# Patient Record
Sex: Female | Born: 1940 | Race: White | Hispanic: No | Smoking: Former smoker
Health system: Southern US, Community
[De-identification: ages and names within clinical notes are randomized; demographics above are authoritative.]

## PROBLEM LIST (undated history)

## (undated) DIAGNOSIS — N321 Vesicointestinal fistula: Secondary | ICD-10-CM

## (undated) DIAGNOSIS — K5730 Diverticulosis of large intestine without perforation or abscess without bleeding: Secondary | ICD-10-CM

## (undated) DIAGNOSIS — E0590 Thyrotoxicosis, unspecified without thyrotoxic crisis or storm: Secondary | ICD-10-CM

## (undated) DIAGNOSIS — C189 Malignant neoplasm of colon, unspecified: Secondary | ICD-10-CM

## (undated) DIAGNOSIS — H3530 Unspecified macular degeneration: Secondary | ICD-10-CM

## (undated) DIAGNOSIS — I10 Essential (primary) hypertension: Secondary | ICD-10-CM

## (undated) DIAGNOSIS — B0050 Herpesviral ocular disease, unspecified: Secondary | ICD-10-CM

## (undated) DIAGNOSIS — K219 Gastro-esophageal reflux disease without esophagitis: Secondary | ICD-10-CM

## (undated) DIAGNOSIS — M1990 Unspecified osteoarthritis, unspecified site: Secondary | ICD-10-CM

## (undated) DIAGNOSIS — E785 Hyperlipidemia, unspecified: Secondary | ICD-10-CM

## (undated) DIAGNOSIS — F329 Major depressive disorder, single episode, unspecified: Secondary | ICD-10-CM

## (undated) DIAGNOSIS — Z933 Colostomy status: Secondary | ICD-10-CM

## (undated) HISTORY — DX: Unspecified macular degeneration: H35.30

## (undated) HISTORY — DX: Herpesviral ocular disease, unspecified: B00.50

## (undated) HISTORY — DX: Gastro-esophageal reflux disease without esophagitis: K21.9

## (undated) HISTORY — DX: Diverticulosis of large intestine without perforation or abscess without bleeding: K57.30

## (undated) HISTORY — PX: DILATION AND CURETTAGE OF UTERUS: SHX78

## (undated) HISTORY — DX: Major depressive disorder, single episode, unspecified: F32.9

## (undated) HISTORY — DX: Colostomy status: Z93.3

## (undated) HISTORY — PX: OTHER SURGICAL HISTORY: SHX169

## (undated) HISTORY — DX: Essential (primary) hypertension: I10

## (undated) HISTORY — DX: Hyperlipidemia, unspecified: E78.5

## (undated) HISTORY — DX: Unspecified osteoarthritis, unspecified site: M19.90

## (undated) HISTORY — DX: Thyrotoxicosis, unspecified without thyrotoxic crisis or storm: E05.90

## (undated) HISTORY — DX: Malignant neoplasm of colon, unspecified: C18.9

## (undated) HISTORY — DX: Vesicointestinal fistula: N32.1

---
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
## 1979-02-17 HISTORY — PX: SEPTOPLASTY: SUR1290

## 1989-11-16 HISTORY — PX: CARDIAC CATHETERIZATION: SHX172

## 1989-12-17 DIAGNOSIS — F32A Depression, unspecified: Secondary | ICD-10-CM

## 1989-12-17 HISTORY — DX: Depression, unspecified: F32.A

## 1993-06-18 DIAGNOSIS — E785 Hyperlipidemia, unspecified: Secondary | ICD-10-CM

## 1993-06-18 HISTORY — DX: Hyperlipidemia, unspecified: E78.5

## 1996-06-18 HISTORY — PX: COLONOSCOPY: SHX174

## 1998-01-05 ENCOUNTER — Ambulatory Visit (HOSPITAL_COMMUNITY): Admission: RE | Admit: 1998-01-05 | Discharge: 1998-01-05 | Payer: Self-pay | Admitting: Gynecology

## 1998-03-28 ENCOUNTER — Emergency Department (HOSPITAL_COMMUNITY): Admission: EM | Admit: 1998-03-28 | Discharge: 1998-03-28 | Payer: Self-pay | Admitting: Emergency Medicine

## 1998-09-14 ENCOUNTER — Other Ambulatory Visit: Admission: RE | Admit: 1998-09-14 | Discharge: 1998-09-14 | Payer: Self-pay | Admitting: Gynecology

## 1999-09-26 ENCOUNTER — Other Ambulatory Visit: Admission: RE | Admit: 1999-09-26 | Discharge: 1999-09-26 | Payer: Self-pay | Admitting: Gynecology

## 2000-05-21 ENCOUNTER — Encounter: Payer: Self-pay | Admitting: Emergency Medicine

## 2000-05-21 ENCOUNTER — Emergency Department (HOSPITAL_COMMUNITY): Admission: EM | Admit: 2000-05-21 | Discharge: 2000-05-21 | Payer: Self-pay | Admitting: Emergency Medicine

## 2000-08-09 ENCOUNTER — Encounter: Payer: Self-pay | Admitting: Orthopaedic Surgery

## 2000-08-09 ENCOUNTER — Encounter: Admission: RE | Admit: 2000-08-09 | Discharge: 2000-08-09 | Payer: Self-pay | Admitting: Orthopaedic Surgery

## 2000-11-28 ENCOUNTER — Other Ambulatory Visit: Admission: RE | Admit: 2000-11-28 | Discharge: 2000-11-28 | Payer: Self-pay | Admitting: Gynecology

## 2001-09-05 ENCOUNTER — Encounter: Admission: RE | Admit: 2001-09-05 | Discharge: 2001-09-05 | Payer: Self-pay | Admitting: Gynecology

## 2001-09-05 ENCOUNTER — Encounter: Payer: Self-pay | Admitting: Gynecology

## 2001-10-29 ENCOUNTER — Encounter: Admission: RE | Admit: 2001-10-29 | Discharge: 2001-10-29 | Payer: Self-pay | Admitting: Orthopaedic Surgery

## 2001-10-29 ENCOUNTER — Encounter: Payer: Self-pay | Admitting: Orthopaedic Surgery

## 2001-12-01 ENCOUNTER — Other Ambulatory Visit: Admission: RE | Admit: 2001-12-01 | Discharge: 2001-12-01 | Payer: Self-pay | Admitting: Gynecology

## 2002-06-18 DIAGNOSIS — K5730 Diverticulosis of large intestine without perforation or abscess without bleeding: Secondary | ICD-10-CM

## 2002-06-18 DIAGNOSIS — K219 Gastro-esophageal reflux disease without esophagitis: Secondary | ICD-10-CM

## 2002-06-18 HISTORY — DX: Diverticulosis of large intestine without perforation or abscess without bleeding: K57.30

## 2002-06-18 HISTORY — DX: Gastro-esophageal reflux disease without esophagitis: K21.9

## 2002-06-24 HISTORY — PX: COLONOSCOPY: SHX174

## 2002-06-24 HISTORY — PX: ESOPHAGOGASTRODUODENOSCOPY: SHX1529

## 2003-03-11 ENCOUNTER — Other Ambulatory Visit: Admission: RE | Admit: 2003-03-11 | Discharge: 2003-03-11 | Payer: Self-pay | Admitting: Family Medicine

## 2003-03-11 ENCOUNTER — Encounter: Payer: Self-pay | Admitting: Family Medicine

## 2003-06-19 HISTORY — PX: BACK SURGERY: SHX140

## 2004-02-18 ENCOUNTER — Encounter: Admission: RE | Admit: 2004-02-18 | Discharge: 2004-02-18 | Payer: Self-pay | Admitting: Orthopedic Surgery

## 2004-03-20 ENCOUNTER — Observation Stay (HOSPITAL_COMMUNITY): Admission: RE | Admit: 2004-03-20 | Discharge: 2004-03-21 | Payer: Self-pay | Admitting: Orthopedic Surgery

## 2004-05-23 ENCOUNTER — Ambulatory Visit: Payer: Self-pay | Admitting: Family Medicine

## 2004-10-19 ENCOUNTER — Ambulatory Visit: Payer: Self-pay | Admitting: Family Medicine

## 2005-02-21 ENCOUNTER — Encounter: Admission: RE | Admit: 2005-02-21 | Discharge: 2005-02-21 | Payer: Self-pay | Admitting: Orthopedic Surgery

## 2005-03-01 ENCOUNTER — Ambulatory Visit: Payer: Self-pay | Admitting: Family Medicine

## 2005-04-03 HISTORY — PX: KNEE ARTHROSCOPY: SUR90

## 2005-04-23 ENCOUNTER — Ambulatory Visit: Payer: Self-pay | Admitting: Family Medicine

## 2005-11-13 ENCOUNTER — Ambulatory Visit: Payer: Self-pay | Admitting: Family Medicine

## 2005-11-15 ENCOUNTER — Ambulatory Visit: Payer: Self-pay | Admitting: Family Medicine

## 2005-11-15 ENCOUNTER — Encounter: Payer: Self-pay | Admitting: Family Medicine

## 2005-11-15 ENCOUNTER — Other Ambulatory Visit: Admission: RE | Admit: 2005-11-15 | Discharge: 2005-11-15 | Payer: Self-pay | Admitting: Family Medicine

## 2006-01-27 ENCOUNTER — Encounter: Admission: RE | Admit: 2006-01-27 | Discharge: 2006-01-27 | Payer: Self-pay | Admitting: Orthopedic Surgery

## 2006-05-20 ENCOUNTER — Ambulatory Visit: Payer: Self-pay | Admitting: Family Medicine

## 2006-05-23 ENCOUNTER — Encounter: Admission: RE | Admit: 2006-05-23 | Discharge: 2006-05-23 | Payer: Self-pay | Admitting: Family Medicine

## 2006-11-20 ENCOUNTER — Ambulatory Visit: Payer: Self-pay | Admitting: Family Medicine

## 2006-11-20 LAB — CONVERTED CEMR LAB
ALT: 26 units/L (ref 0–40)
BUN: 10 mg/dL (ref 6–23)
Basophils Absolute: 0 10*3/uL (ref 0.0–0.1)
Basophils Relative: 0.4 % (ref 0.0–1.0)
CO2: 30 meq/L (ref 19–32)
Calcium: 9.8 mg/dL (ref 8.4–10.5)
Chloride: 102 meq/L (ref 96–112)
Direct LDL: 106 mg/dL
Eosinophils Absolute: 0.1 10*3/uL (ref 0.0–0.6)
Eosinophils Relative: 2.7 % (ref 0.0–5.0)
GFR calc Af Amer: 93 mL/min
Lymphocytes Relative: 44.8 % (ref 12.0–46.0)
Monocytes Absolute: 0.5 10*3/uL (ref 0.2–0.7)
Monocytes Relative: 9.4 % (ref 3.0–11.0)
RBC: 4.33 M/uL (ref 3.87–5.11)
Total Bilirubin: 0.7 mg/dL (ref 0.3–1.2)
Total CHOL/HDL Ratio: 7.6
Triglycerides: 869 mg/dL (ref 0–149)
VLDL: 174 mg/dL — ABNORMAL HIGH (ref 0–40)

## 2006-11-21 ENCOUNTER — Encounter: Payer: Self-pay | Admitting: Family Medicine

## 2006-11-21 DIAGNOSIS — K5730 Diverticulosis of large intestine without perforation or abscess without bleeding: Secondary | ICD-10-CM | POA: Insufficient documentation

## 2006-11-21 DIAGNOSIS — K219 Gastro-esophageal reflux disease without esophagitis: Secondary | ICD-10-CM

## 2006-11-21 DIAGNOSIS — F329 Major depressive disorder, single episode, unspecified: Secondary | ICD-10-CM

## 2006-11-21 DIAGNOSIS — I10 Essential (primary) hypertension: Secondary | ICD-10-CM | POA: Insufficient documentation

## 2006-11-21 DIAGNOSIS — E785 Hyperlipidemia, unspecified: Secondary | ICD-10-CM

## 2006-11-22 ENCOUNTER — Encounter: Payer: Self-pay | Admitting: Family Medicine

## 2006-11-22 ENCOUNTER — Other Ambulatory Visit: Admission: RE | Admit: 2006-11-22 | Discharge: 2006-11-22 | Payer: Self-pay | Admitting: Family Medicine

## 2006-11-22 ENCOUNTER — Ambulatory Visit: Payer: Self-pay | Admitting: Family Medicine

## 2006-11-22 LAB — CONVERTED CEMR LAB: Pap Smear: NORMAL

## 2006-12-03 ENCOUNTER — Encounter (INDEPENDENT_AMBULATORY_CARE_PROVIDER_SITE_OTHER): Payer: Self-pay | Admitting: *Deleted

## 2007-02-05 ENCOUNTER — Telehealth (INDEPENDENT_AMBULATORY_CARE_PROVIDER_SITE_OTHER): Payer: Self-pay | Admitting: *Deleted

## 2007-02-11 ENCOUNTER — Ambulatory Visit: Payer: Self-pay | Admitting: Family Medicine

## 2007-02-19 ENCOUNTER — Encounter: Payer: Self-pay | Admitting: Family Medicine

## 2007-02-24 ENCOUNTER — Encounter (INDEPENDENT_AMBULATORY_CARE_PROVIDER_SITE_OTHER): Payer: Self-pay | Admitting: *Deleted

## 2007-03-10 ENCOUNTER — Telehealth (INDEPENDENT_AMBULATORY_CARE_PROVIDER_SITE_OTHER): Payer: Self-pay | Admitting: *Deleted

## 2007-05-22 ENCOUNTER — Ambulatory Visit: Payer: Self-pay | Admitting: Family Medicine

## 2007-05-28 ENCOUNTER — Ambulatory Visit: Payer: Self-pay | Admitting: Family Medicine

## 2007-05-28 LAB — CONVERTED CEMR LAB
CO2: 27 meq/L (ref 19–32)
Calcium: 9.9 mg/dL (ref 8.4–10.5)
Chloride: 103 meq/L (ref 96–112)
GFR calc Af Amer: 81 mL/min
GFR calc non Af Amer: 67 mL/min

## 2007-06-02 ENCOUNTER — Ambulatory Visit: Payer: Self-pay | Admitting: Family Medicine

## 2007-11-27 ENCOUNTER — Ambulatory Visit: Payer: Self-pay | Admitting: Family Medicine

## 2007-11-27 LAB — CONVERTED CEMR LAB
ALT: 22 units/L (ref 0–35)
AST: 25 units/L (ref 0–37)
Albumin: 3.6 g/dL (ref 3.5–5.2)
Alkaline Phosphatase: 61 units/L (ref 39–117)
Basophils Absolute: 0 10*3/uL (ref 0.0–0.1)
Cholesterol: 263 mg/dL (ref 0–200)
Creatinine, Ser: 0.9 mg/dL (ref 0.4–1.2)
Eosinophils Absolute: 0.2 10*3/uL (ref 0.0–0.7)
Eosinophils Relative: 2.4 % (ref 0.0–5.0)
GFR calc Af Amer: 81 mL/min
HDL: 44.2 mg/dL (ref >39.0)
Hemoglobin: 12.9 g/dL (ref 12.0–15.0)
Lymphocytes Relative: 30.6 % (ref 12.0–46.0)
MCV: 90.9 fL (ref 78.0–100.0)
Monocytes Absolute: 0.5 10*3/uL (ref 0.1–1.0)
Monocytes Relative: 6.8 % (ref 3.0–12.0)
Platelets: 186 10*3/uL (ref 150–400)
Potassium: 4.5 meq/L (ref 3.5–5.1)
RBC: 4.08 M/uL (ref 3.87–5.11)
RDW: 12.9 % (ref 11.5–14.6)
Total CHOL/HDL Ratio: 6
Triglycerides: 296 mg/dL (ref 0–149)
VLDL: 59 mg/dL — ABNORMAL HIGH (ref 0–40)

## 2007-11-28 ENCOUNTER — Other Ambulatory Visit: Admission: RE | Admit: 2007-11-28 | Discharge: 2007-11-28 | Payer: Self-pay | Admitting: Family Medicine

## 2007-11-28 ENCOUNTER — Ambulatory Visit: Payer: Self-pay | Admitting: Family Medicine

## 2007-11-28 ENCOUNTER — Encounter: Payer: Self-pay | Admitting: Family Medicine

## 2007-11-28 LAB — CONVERTED CEMR LAB: Pap Smear: NORMAL

## 2007-12-04 ENCOUNTER — Encounter (INDEPENDENT_AMBULATORY_CARE_PROVIDER_SITE_OTHER): Payer: Self-pay | Admitting: *Deleted

## 2008-02-27 ENCOUNTER — Ambulatory Visit: Payer: Self-pay | Admitting: Family Medicine

## 2008-02-28 LAB — CONVERTED CEMR LAB
ALT: 31 units/L (ref 0–35)
AST: 36 units/L (ref 0–37)
HDL: 33.2 mg/dL — ABNORMAL LOW (ref >39.0)
VLDL: 44 mg/dL — ABNORMAL HIGH (ref 0–40)

## 2008-03-04 ENCOUNTER — Ambulatory Visit: Payer: Self-pay | Admitting: Family Medicine

## 2008-03-09 ENCOUNTER — Telehealth: Payer: Self-pay | Admitting: Family Medicine

## 2008-06-07 ENCOUNTER — Ambulatory Visit: Payer: Self-pay | Admitting: Family Medicine

## 2008-07-22 ENCOUNTER — Ambulatory Visit: Payer: Self-pay | Admitting: Family Medicine

## 2008-11-29 ENCOUNTER — Ambulatory Visit: Payer: Self-pay | Admitting: Family Medicine

## 2008-11-29 LAB — CONVERTED CEMR LAB
AST: 30 units/L (ref 0–37)
Albumin: 3.8 g/dL (ref 3.5–5.2)
BUN: 17 mg/dL (ref 6–23)
Basophils Absolute: 0 10*3/uL (ref 0.0–0.1)
Bilirubin, Direct: 0.1 mg/dL (ref 0.0–0.3)
CO2: 31 meq/L (ref 19–32)
Calcium: 10 mg/dL (ref 8.4–10.5)
Chloride: 113 meq/L — ABNORMAL HIGH (ref 96–112)
Creatinine, Ser: 0.9 mg/dL (ref 0.4–1.2)
Direct LDL: 155.4 mg/dL
Eosinophils Absolute: 0.2 10*3/uL (ref 0.0–0.7)
Eosinophils Relative: 3.5 % (ref 0.0–5.0)
GFR calc non Af Amer: 66.52 mL/min (ref >60)
Glucose, Bld: 100 mg/dL — ABNORMAL HIGH (ref 70–99)
HCT: 41.7 % (ref 36.0–46.0)
Lymphocytes Relative: 42.1 % (ref 12.0–46.0)
Monocytes Absolute: 0.4 10*3/uL (ref 0.1–1.0)
Monocytes Relative: 9.1 % (ref 3.0–12.0)
Neutro Abs: 2 10*3/uL (ref 1.4–7.7)
Potassium: 5 meq/L (ref 3.5–5.1)
Sodium: 145 meq/L (ref 135–145)
Triglycerides: 248 mg/dL — ABNORMAL HIGH (ref 0.0–149.0)

## 2008-12-07 ENCOUNTER — Encounter: Payer: Self-pay | Admitting: Family Medicine

## 2008-12-07 ENCOUNTER — Other Ambulatory Visit: Admission: RE | Admit: 2008-12-07 | Discharge: 2008-12-07 | Payer: Self-pay | Admitting: Family Medicine

## 2008-12-07 ENCOUNTER — Ambulatory Visit: Payer: Self-pay | Admitting: Family Medicine

## 2008-12-10 ENCOUNTER — Encounter (INDEPENDENT_AMBULATORY_CARE_PROVIDER_SITE_OTHER): Payer: Self-pay | Admitting: *Deleted

## 2008-12-14 ENCOUNTER — Encounter: Admission: RE | Admit: 2008-12-14 | Discharge: 2008-12-14 | Payer: Self-pay | Admitting: Family Medicine

## 2008-12-21 ENCOUNTER — Ambulatory Visit: Payer: Self-pay | Admitting: Family Medicine

## 2008-12-24 ENCOUNTER — Encounter: Admission: RE | Admit: 2008-12-24 | Discharge: 2008-12-24 | Payer: Self-pay | Admitting: Family Medicine

## 2009-01-10 ENCOUNTER — Ambulatory Visit: Payer: Self-pay | Admitting: Family Medicine

## 2009-01-10 DIAGNOSIS — N3000 Acute cystitis without hematuria: Secondary | ICD-10-CM

## 2009-01-10 LAB — CONVERTED CEMR LAB
Casts: 0 /lpf
Glucose, Urine, Semiquant: NEGATIVE
Ketones, urine, test strip: NEGATIVE
Nitrite: NEGATIVE
Protein, U semiquant: NEGATIVE
Urobilinogen, UA: 0.2
pH: 6

## 2009-01-11 ENCOUNTER — Ambulatory Visit: Admission: RE | Admit: 2009-01-11 | Discharge: 2009-01-11 | Payer: Self-pay | Admitting: Gynecologic Oncology

## 2009-01-11 ENCOUNTER — Encounter: Payer: Self-pay | Admitting: Family Medicine

## 2009-01-16 HISTORY — PX: EXPLORATORY LAPAROTOMY: SUR591

## 2009-02-08 ENCOUNTER — Encounter: Payer: Self-pay | Admitting: Gynecologic Oncology

## 2009-02-08 ENCOUNTER — Inpatient Hospital Stay (HOSPITAL_COMMUNITY): Admission: RE | Admit: 2009-02-08 | Discharge: 2009-02-13 | Payer: Self-pay | Admitting: Obstetrics & Gynecology

## 2009-02-08 HISTORY — PX: TOTAL ABDOMINAL HYSTERECTOMY W/ BILATERAL SALPINGOOPHORECTOMY: SHX83

## 2009-02-08 HISTORY — PX: APPENDECTOMY: SHX54

## 2009-02-08 HISTORY — PX: OMENTECTOMY: SHX2098

## 2009-02-08 HISTORY — PX: OTHER SURGICAL HISTORY: SHX169

## 2009-02-10 ENCOUNTER — Telehealth: Payer: Self-pay | Admitting: Family Medicine

## 2009-02-12 DIAGNOSIS — H6500 Acute serous otitis media, unspecified ear: Secondary | ICD-10-CM

## 2009-02-12 DIAGNOSIS — C180 Malignant neoplasm of cecum: Secondary | ICD-10-CM | POA: Insufficient documentation

## 2009-02-22 ENCOUNTER — Ambulatory Visit: Admission: RE | Admit: 2009-02-22 | Discharge: 2009-02-22 | Payer: Self-pay | Admitting: Gynecologic Oncology

## 2009-02-22 ENCOUNTER — Encounter: Payer: Self-pay | Admitting: Family Medicine

## 2009-02-24 ENCOUNTER — Encounter: Payer: Self-pay | Admitting: Family Medicine

## 2009-03-17 ENCOUNTER — Encounter: Payer: Self-pay | Admitting: Family Medicine

## 2009-03-28 ENCOUNTER — Ambulatory Visit: Payer: Self-pay | Admitting: Family Medicine

## 2009-03-31 ENCOUNTER — Encounter: Payer: Self-pay | Admitting: Family Medicine

## 2009-04-07 ENCOUNTER — Encounter: Payer: Self-pay | Admitting: Family Medicine

## 2009-04-07 ENCOUNTER — Ambulatory Visit: Admission: RE | Admit: 2009-04-07 | Discharge: 2009-04-07 | Payer: Self-pay | Admitting: Gynecologic Oncology

## 2009-04-14 ENCOUNTER — Encounter: Payer: Self-pay | Admitting: Family Medicine

## 2009-06-01 ENCOUNTER — Ambulatory Visit: Admission: RE | Admit: 2009-06-01 | Discharge: 2009-06-01 | Payer: Self-pay | Admitting: Gynecologic Oncology

## 2009-06-02 ENCOUNTER — Encounter: Payer: Self-pay | Admitting: Family Medicine

## 2009-06-18 HISTORY — PX: OTHER SURGICAL HISTORY: SHX169

## 2009-06-30 ENCOUNTER — Encounter: Payer: Self-pay | Admitting: Family Medicine

## 2009-08-25 ENCOUNTER — Encounter: Payer: Self-pay | Admitting: Family Medicine

## 2009-08-29 ENCOUNTER — Encounter: Payer: Self-pay | Admitting: Family Medicine

## 2009-09-27 ENCOUNTER — Ambulatory Visit: Payer: Self-pay | Admitting: Family Medicine

## 2009-09-28 ENCOUNTER — Encounter: Payer: Self-pay | Admitting: Family Medicine

## 2009-09-28 ENCOUNTER — Telehealth: Payer: Self-pay | Admitting: Family Medicine

## 2009-09-28 LAB — CONVERTED CEMR LAB
Basophils Absolute: 0 10*3/uL (ref 0.0–0.1)
Basophils Relative: 0 % (ref 0–1)
Eosinophils Relative: 0 % (ref 0–5)
Hemoglobin: 11.8 g/dL — ABNORMAL LOW (ref 12.0–15.0)
Lymphocytes Relative: 5 % — ABNORMAL LOW (ref 12–46)
Lymphs Abs: 3.6 10*3/uL (ref 0.7–4.0)
Neutro Abs: 66.3 10*3/uL — ABNORMAL HIGH (ref 1.7–7.7)
RBC: 3.63 M/uL — ABNORMAL LOW (ref 3.87–5.11)
RDW: 17 % — ABNORMAL HIGH (ref 11.5–15.5)

## 2009-09-29 ENCOUNTER — Telehealth: Payer: Self-pay | Admitting: Family Medicine

## 2009-09-29 ENCOUNTER — Ambulatory Visit: Payer: Self-pay | Admitting: Family Medicine

## 2009-09-29 LAB — CONVERTED CEMR LAB
Basophils Absolute: 0.5 10*3/uL — ABNORMAL HIGH (ref 0.0–0.1)
Basophils Relative: 1 % (ref 0–1)
Hemoglobin: 10.8 g/dL — ABNORMAL LOW (ref 12.0–15.0)
Lymphocytes Relative: 7 % — ABNORMAL LOW (ref 12–46)
Lymphs Abs: 3.4 10*3/uL (ref 0.7–4.0)
MCHC: 33.4 g/dL (ref 30.0–36.0)
MCV: 95.6 fL (ref 78.0–100.0)
Neutro Abs: 44.2 10*3/uL — ABNORMAL HIGH (ref 1.7–7.7)
Platelets: 181 10*3/uL (ref 150–400)

## 2009-09-30 ENCOUNTER — Encounter: Payer: Self-pay | Admitting: Family Medicine

## 2009-10-07 ENCOUNTER — Encounter: Payer: Self-pay | Admitting: Family Medicine

## 2009-10-12 ENCOUNTER — Encounter: Payer: Self-pay | Admitting: Family Medicine

## 2009-10-13 ENCOUNTER — Encounter: Payer: Self-pay | Admitting: Family Medicine

## 2009-10-21 ENCOUNTER — Encounter: Payer: Self-pay | Admitting: Family Medicine

## 2009-10-21 HISTORY — PX: COLECTOMY: SHX59

## 2009-11-01 ENCOUNTER — Encounter: Payer: Self-pay | Admitting: Family Medicine

## 2009-11-10 ENCOUNTER — Encounter: Payer: Self-pay | Admitting: Family Medicine

## 2009-11-17 ENCOUNTER — Encounter: Payer: Self-pay | Admitting: Family Medicine

## 2009-12-22 ENCOUNTER — Encounter: Payer: Self-pay | Admitting: Family Medicine

## 2010-01-24 ENCOUNTER — Encounter (INDEPENDENT_AMBULATORY_CARE_PROVIDER_SITE_OTHER): Payer: Self-pay | Admitting: *Deleted

## 2010-01-24 ENCOUNTER — Ambulatory Visit: Payer: Self-pay | Admitting: Family Medicine

## 2010-01-24 DIAGNOSIS — R300 Dysuria: Secondary | ICD-10-CM | POA: Insufficient documentation

## 2010-01-24 LAB — CONVERTED CEMR LAB
Nitrite: NEGATIVE
Specific Gravity, Urine: 1.015
Urobilinogen, UA: 0.2
WBC Urine, dipstick: NEGATIVE

## 2010-01-25 ENCOUNTER — Encounter: Payer: Self-pay | Admitting: Family Medicine

## 2010-02-02 ENCOUNTER — Encounter: Payer: Self-pay | Admitting: Family Medicine

## 2010-02-16 ENCOUNTER — Encounter: Payer: Self-pay | Admitting: Family Medicine

## 2010-03-10 ENCOUNTER — Encounter: Payer: Self-pay | Admitting: Family Medicine

## 2010-03-30 ENCOUNTER — Encounter: Payer: Self-pay | Admitting: Family Medicine

## 2010-04-12 ENCOUNTER — Encounter: Payer: Self-pay | Admitting: Family Medicine

## 2010-04-12 LAB — HM MAMMOGRAPHY: HM Mammogram: NORMAL

## 2010-06-29 ENCOUNTER — Encounter: Payer: Self-pay | Admitting: Family Medicine

## 2010-07-20 NOTE — Letter (Signed)
Summary: Dr.Karen Stitzenberg,[facility] Surgical Oncology,Note  Dr.Karen [facility] Surgical Oncology,Note   Imported By: Beau Fanny 10/19/2009 15:08:31  _____________________________________________________________________  External Attachment:    Type:   Image     Comment:   External Document

## 2010-07-20 NOTE — Letter (Signed)
Summary: [facility] Surgical Oncology & Endocrine Surgery  [facility] Surgical Oncology & Endocrine Surgery   Imported By: Lanelle Bal 02/15/2010 09:31:36  _____________________________________________________________________  External Attachment:    Type:   Image     Comment:   External Document  Appended Document: [facility] Surgical Oncology & Endocrine Surgery    Clinical Lists Changes
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
## 2010-07-20 NOTE — Letter (Signed)
Summary: Hematology/Oncology/[facility]  Hematology/Oncology/[facility]   Imported By: Lester [place] 12/29/2009 12:07:48  _____________________________________________________________________  External Attachment:    Type:   Image     Comment:   External Document  Appended Document: Hematology/Oncology/[facility]    Clinical Lists Changes  Observations: Added new observation of PAST MED HX: Depression: 12/17/1989 Diverticulosis, colon: 06/2002 GERD: 06/2002 Hyperlipidemia: 06/1993 Hyperthyroidism Metastatic colon CA s/p resection w/o evidence of disease as of 2011.  Followed by [facility] Colostomy H/o colovesical fistula (12/29/2009 14:55)        Past History:  Past Medical History: Depression: 12/17/1989 Diverticulosis, colon: 06/2002 GERD: 06/2002 Hyperlipidemia: 06/1993 Hyperthyroidism Metastatic colon CA s/p resection w/o evidence of disease as of 2011.  Followed by [facility] Colostomy H/o colovesical fistula

## 2010-07-20 NOTE — Letter (Signed)
Summary: [facility] Healthcare-GYN Oncology  [facility] Healthcare-GYN Oncology   Imported By: Maryln Gottron 03/17/2010 13:33:49  _____________________________________________________________________  External Attachment:    Type:   Image     Comment:   External Document  Appended Document: [facility] Healthcare-GYN Oncology    Clinical Lists Changes

## 2010-07-20 NOTE — Consult Note (Signed)
Summary: [facility] Hematology Oncology  [facility] Hematology Oncology   Imported By: Lanelle Bal 08/31/2009 13:23:16  _____________________________________________________________________  External Attachment:    Type:   Image     Comment:   External Document

## 2010-07-20 NOTE — Consult Note (Signed)
Summary: [facility] Hematology Oncology  [facility] Hematology Oncology   Imported By: Lanelle Bal 07/06/2009 09:24:07  _____________________________________________________________________  External Attachment:    Type:   Image     Comment:   External Document

## 2010-07-20 NOTE — Consult Note (Signed)
Summary: [facility],Note  Dr.Tammy Triglianos,[facility] Oncology,Note   Imported By: Beau Fanny 09/30/2009 16:30:46  _____________________________________________________________________  External Attachment:    Type:   Image     Comment:   External Document

## 2010-07-20 NOTE — Consult Note (Signed)
Summary: Dr.Richard Eunice [facility] On[facility],Note  Dr.Richard [facility] Oncology,Note   Imported By: Beau Fanny 08/31/2009 11:51:58  _____________________________________________________________________  External Attachment:    Type:   Image     Comment:   External Document

## 2010-07-20 NOTE — Letter (Signed)
Summary: Connye Burkitt Triglianos,A.N.P.,Oncology,Note  Tammy Ann Triglianos,A.N.P.,Oncology,Note   Imported By: Beau Fanny 11/23/2009 14:40:39  _____________________________________________________________________  External Attachment:    Type:   Image     Comment:   External Document

## 2010-07-20 NOTE — Op Note (Signed)
Summary: [facility]  Cystogram,Dr.Eric [facility]   Imported By: Beau Fanny 11/04/2009 11:01:21  _____________________________________________________________________  External Attachment:    Type:   Image     Comment:   External Document

## 2010-07-20 NOTE — Progress Notes (Signed)
Summary: call a nurse  Phone Note Call from Patient   Call For: Shaune Leeks MD Summary of Call: Triage Record Num: [id number] Operator: Tomasita Crumble Patient Name: Whitney Meza Call Date & Time: 09/28/2009 5:31:18PM Patient Phone: [phone] PCP: Arta Silence Patient Gender: Female PCP Fax : Patient DOB: 1940-10-08 Practice Name: [facility] Reason for Call: Delaney Meigs from [facility] caling [phone] regarding a CBS on this patient. Abnormals and Alert values reported; no criticals. WBC high at 72.1 , RBC 3.63 low; Hgb 11.8 low, Hct 35.0 low, RDW high 17.0, Granulocytes % 92 high, Absolute Granulocytess 66.3. Lymphs % low @ 5; Abs Monos 2.2, WBC Morphology - noted increased bands > 20%, vaculated neutrophils. Smear review: excessive amounts of fibrin strands causing W@BC  clotting. WBC Count may be affected. Per PCP Calls protocol and standing orders information noted and faxed to office. Protocol(s) Used: PCP Calls, No Triage (Adult) Recommended Outcome per Protocol: Call Provider within 24 Hours Reason for Outcome: Lab calling with test results Care Advice: Initial call taken by: Melody Comas,  September 29, 2009 1:30 PM

## 2010-07-20 NOTE — Letter (Signed)
Summary: Dr.Matthew [facility] Urology Surgery,Note  Dr.Matthew [facility] Urology Surgery,Note   Imported By: Beau Fanny 10/21/2009 15:27:29  _____________________________________________________________________  External Attachment:    Type:   Image     Comment:   External Document

## 2010-07-20 NOTE — Letter (Signed)
Summary: [facility] / F/U OF METASTATIC COLON CANCER / DR. Toniann Fail [facility] / F/U OF METASTATIC COLON CANCER / DR. Toniann Fail BREWSTER   Imported By: Carin Primrose 10/10/2009 14:54:59  _____________________________________________________________________  External Attachment:    Type:   Image     Comment:   External Document
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
## 2010-07-20 NOTE — Progress Notes (Signed)
Summary: Abd. pain  Phone Note Outgoing Call   Call placed by: Mills Koller,  September 29, 2009 8:17 AM Call placed to: Patient Summary of Call: Follow up on phone call yesterday to  patient  for repeat CBC. When I spoke to her today she said she is still in a lot of pain lower abd. No BM for 2 days.  Initial call taken by: Mills Koller,  September 29, 2009 8:20 AM  Follow-up for Phone Call        Left message on machine for patient to call back. Sydell Axon LPN  September 29, 2009 9:22 AM Spoke to patient and was informed that she may be constipated because she has not had a BM in 3 days and has had some gas pains. Was informed that she has already called Delma at Dr. Luanne Bras office and they have recommended that she take Miralax and see if this helps the problem. Patient stated that she has already been to the pharmacy and picked this up to start taking. Follow-up by: Sydell Axon LPN,  September 29, 2009 12:39 PM  Additional Follow-up for Phone Call Additional follow up Details #1::        Spoke with pt. She had taken Miralax per suggestion of Dr Luanne Bras office. She has not yet had BM but pain is slightly improved.  Her CBC has now officially been read as elevated to 48.6 from 70+ and I have faxed that to Dr Sandi Mealy nurse as directed. U/C still pending. I do not think UTI is the problem. She will be seen tomm by Dr Luanne Bras associate and go from there. Additional Follow-up by: Shaune Leeks MD,  September 29, 2009 4:18 PM

## 2010-07-20 NOTE — Letter (Signed)
Summary: Dr.Karen Stitzenberg,[facility] Surgical Oncology,Note  Dr.Karen [facility] Surgical Oncology,Note   Imported By: Beau Fanny 11/17/2009 08:19:04  _____________________________________________________________________  External Attachment:    Type:   Image     Comment:   External Document

## 2010-07-20 NOTE — Assessment & Plan Note (Signed)
Summary: ? UTI/ lb   Vital Signs:  Patient profile:   70 year old female Weight:      161.25 pounds Temp:     98.7 degrees F oral Pulse rate:   92 / minute Pulse rhythm:   regular BP sitting:   122 / 64  (left arm) Cuff size:   regular  Vitals Entered By: Sydell Axon LPN (September 27, 2009 3:54 PM) CC: ? UTI, lower abd. pain   History of Present Illness: Pt went yesterday for trmt and drove herself and felt bad. She then had pain in the suprapubic area for knife-like pain. Her sxs arerelated to urination with the sharp pains. She had a mild UTI two weeks ago. She has been unable to give specimen here yet. She has been sleeping poorly so took 2 pain pills last night due to the pain and slept well all night long! She denies fever but digital thermometer gave spurious nos within short amount of time. Thermometer is old.  Last chemo was last Thu. with a shot for augmentation  trmt yesterday.   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 1 Once Daily Prn 2)  Calcium Antacid 500 Mg Chew (Calcium Carbonate Antacid) .Marland Kitchen.. 1 Bid 3)  Prinivil 20 Mg Tabs (Lisinopril) .Marland Kitchen.. 1 Tablet Daily By Mouth 4)  Acyclovir 800 Mg Tabs (Acyclovir) .... As Directed By Eye Doctor 5)  Activella 0.5-0.1 Mg Tabs (Estradiol-Norethindrone Acet) .... One Tqb By Mouth Once Daily 6)  Fish Oil   Oil (Fish Oil) .... Take 1 Tablet By Mouth Once A  Day 7)  Aleve 220 Mg Tabs (Naproxen Sodium) .... Take 1 Tablet By Mouth Once A Day 8)  Cipro 250 Mg Tabs (Ciprofloxacin Hcl) .Marland Kitchen.. 1 By Mouth Two Times A Day For 3 Days  Allergies: 1)  ! * Zyban 2)  ! Sulfa  Physical Exam  General:  Less overweight and  generally well-appearing, nontoxic and looking good knowing she is on chemo, nontoxic and interactive.  Head:  normocephalic, atraumatic, and no abnormalities observed.   Eyes:  Conjunctiva clear bilaterally.  Ears:  External ear exam shows no significant lesions or deformities.  Otoscopic examination reveals clear canals, tympanic membranes are intact bilaterally without bulging, retraction, inflammation or discharge. Hearing is grossly normal bilaterally. R TM mildly dull. Nose:  External nasal examination shows no deformity or inflammation. Nasal mucosa are pink and moist without lesions or exudates. Mouth:  Oral mucosa and oropharynx without lesions or exudates.  Teeth in good repair. Min clear PND. Neck:  No deformities, masses, or tenderness noted. Lungs:  Normal respiratory effort, chest expands symmetrically. Lungs are clear to auscultation, no crackles or wheezes. Heart:  Normal rate and regular rhythm. S1 and S2 normal without gallop, murmur, click, rub or other extra sounds. Abdomen:  no suprapubic tenderness or fullness felt  soft and normal bowel sounds.   Msk:  no CVA  tenderness  no LS tenderness nl rom spine    Impression & Recommendations:  Problem # 1:  ACUTE CYSTITIS (ICD-595.0) Assessment Unchanged Recurrent, assume UTI as sxs c/w this. Can't give urine sample so will get CBC and have her bring urine tomm for U/A micro and culture. Will treat with Cipro as she thinks she had Macrodantin the last time. The following medications were removed from the medication list:    Cipro 250 Mg Tabs (Ciprofloxacin hcl) .Marland Kitchen... 1 by mouth two times a day for 3 days Her updated medication list for this problem includes:    Cipro  500 Mg Tabs (Ciprofloxacin hcl) ..... One tab by mouth two times a day  Orders: TLB-CBC Platelet - w/Differential (85025-[id number]) Venipuncture (78469)  Complete Medication List: 1)  Multivitamins Tabs (Multiple vitamin) .Marland Kitchen.. 1 once daily prn 2)  Calcium Antacid 500 Mg Chew (Calcium carbonate antacid) .Marland Kitchen.. 1 bid 3)  Prinivil 20 Mg Tabs (Lisinopril) .Marland Kitchen.. 1 tablet daily by mouth 4)  Acyclovir 800 Mg Tabs (Acyclovir) .... As directed by eye doctor 5)  Aleve 220 Mg Tabs (Naproxen sodium) .... Take 1 tablet by mouth once a day 6)  Hydrocodone-acetaminophen 5-325 Mg Tabs (Hydrocodone-acetaminophen) .... Take 1-2 by mouth every 4-6 hours as needed pain 7)  Cipro 500 Mg Tabs (Ciprofloxacin hcl) .... One tab by mouth two times a day  Patient Instructions: 1)  Try to give urine sample if possible before taking Cipro and put on ice. If can't urinate by bedtime, take dose of Cipro and get urine sample in AM and bring in.  2)  Will send note to [facility]. Prescriptions: CIPRO 500 MG TABS (CIPROFLOXACIN HCL) one tab by mouth two times a day  #20 x 0   Entered and Authorized by:   Shaune Leeks MD   Signed by:   Shaune Leeks MD on 09/27/2009   Method used:   Electronically to        [facility]* (retail)       [address]       [place], [place]  [address]       Ph: [phone]       Fax: [phone]   RxID:   [id number]   Current Allergies (reviewed today): ! Janyth Pupa ! SULFA  Appended Document: ? UTI/ lb  Laboratory Results   Urine Tests  Date/Time Received: September 28, 2009 9:32 AM  Date/Time Reported: September 28, 2009 9:32 AM   Routine Urinalysis   Color: yellow Appearance: Clear Glucose: negative   (Normal Range: Negative) Bilirubin: negative   (Normal Range: Negative) Ketone: negative   (Normal Range: Negative) Spec. Gravity: >=1.030   (Normal Range: 1.003-1.035) Blood: negative   (Normal Range: Negative) pH: 6.0   (Normal Range: 5.0-8.0) Protein: negative    (Normal Range: Negative) Urobilinogen: 0.2   (Normal Range: 0-1) Nitrite: negative   (Normal Range: Negative) Leukocyte Esterace: trace   (Normal Range: Negative)  Urine Microscopic WBC/HPF: 0-1 RBC/HPF: 0 Bacteria/HPF: 1+ Epithelial/HPF: Rare

## 2010-07-20 NOTE — Progress Notes (Signed)
Summary: Critical lab  Phone Note From Other Clinic   Caller: Karen/Lab Call For: Dr. Hetty Ely Summary of Call: Critical lab; WBC 78,500 Initial call taken by: Sydell Axon LPN,  September 28, 2009 1:02 PM  Follow-up for Phone Call        Spoke with Clydie Braun in lab , specimen to [facility], cannot be run at [facility] since wbc so elevated.  results should be ready within 4 hours. Clydie Braun says we can call [facility] at [phone] ext [phone] and give act # [phone] if results not reported by 5 and you want them before leaving today. Natasha Chavers CMA Duncan Dull)  September 28, 2009 1:23 PM   Additional Follow-up for Phone Call Additional follow up Details #1::        The Lab at [facility]) call and would like a redraw on this patient for the CBC. He said there is so many  WBC that they are in clumps and the WBC count is much higher that what is reported. I told him to release  the report with that comment and you would have all the imnformation to do what you want to do. Additional Follow-up by: Mills Koller,  September 28, 2009 4:18 PM    Additional Follow-up for Phone Call Additional follow up Details #2::    Please redraw tomm. Pt has cancer but has had nml white counts recently. She has been on steroids but I don't think recently. Follow-up by: Shaune Leeks MD,  September 28, 2009 5:01 PM  Additional Follow-up for Phone Call Additional follow up Details #3:: Details for Additional Follow-up Action Taken: [facility] for her to come in the am for blood draw. I put her on the lab schedule. Additional Follow-up by: Mills Koller,  September 28, 2009 5:06 PM

## 2010-07-20 NOTE — Letter (Signed)
Summary: [facility] Hematology Oncology  [facility] Hematology Oncology   Imported By: Lanelle Bal 02/27/2010 11:41:49  _____________________________________________________________________  External Attachment:    Type:   Image     Comment:   External Document  Appended Document: [facility] Hematology Oncology    Clinical Lists Changes

## 2010-07-20 NOTE — Letter (Signed)
Summary: Nadara Eaton letter  [place] [facility]  [address] [place], [place] [address]   Phone: [phone]  Fax: [phone]       01/24/2010 MRN: [id number]  Kayleana Lusty [address] [place], [place]  [address]  Dear Ms. Lovering,  [facility], and [place] announce the retirement of Arta Silence, M.D., from full-time practice at the [facility] office effective December 15, 2009 and his plans of returning part-time.  It is important to Dr. Hetty Ely and to our practice that you understand that [facility] has seven physicians in our office for your health care needs.  We will continue to offer the same exceptional care that you have today.    Dr. Hetty Ely has spoken to many of you about his plans for retirement and returning part-time in the fall.   We will continue to work with you through the transition to schedule appointments for you in the office and meet the high standards that [place] is committed to.   Again, it is with great pleasure that we share the news that Dr. Hetty Ely will return to [facility] at [facility] in October of 2011 with a reduced schedule.    If you have any questions, or would like to request an appointment with one of our physicians, please call us at [phone] and press the option for Scheduling an appointment.  We take pleasure in providing you with excellent patient care and look forward to seeing you at your next office visit.  Our [facility] Physicians are:  Tillman Abide, M.D. Laurita Quint, M.D. Roxy Manns, M.D. Kerby Nora, M.D. Hannah Beat, M.D. Ruthe Mannan, M.D. We proudly welcomed Raechel Ache, M.D. and Eustaquio Boyden, M.D. to the practice in July/August 2011.  Sincerely,  [facility]

## 2010-07-20 NOTE — Letter (Signed)
Summary: [facility] Hematology Oncology  [facility] Hematology Oncology   Imported By: Lanelle Bal 04/10/2010 08:14:53  _____________________________________________________________________  External Attachment:    Type:   Image     Comment:   External Document

## 2010-07-26 NOTE — Letter (Signed)
Summary: Surgical Oncology/[facility]  Surgical Oncology/[facility]   Imported By: Lester Shrewsbury 07/17/2010 09:47:26  _____________________________________________________________________  External Attachment:    Type:   Image     Comment:   External Document

## 2010-08-10 ENCOUNTER — Ambulatory Visit (INDEPENDENT_AMBULATORY_CARE_PROVIDER_SITE_OTHER): Payer: 59 | Admitting: Family Medicine

## 2010-08-10 ENCOUNTER — Encounter: Payer: Self-pay | Admitting: Family Medicine

## 2010-08-10 DIAGNOSIS — J019 Acute sinusitis, unspecified: Secondary | ICD-10-CM
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
## 2010-09-23 LAB — DIFFERENTIAL
Basophils Absolute: 0 10*3/uL (ref 0.0–0.1)
Basophils Relative: 1 % (ref 0–1)
Lymphocytes Relative: 22 % (ref 12–46)
Lymphocytes Relative: 39 % (ref 12–46)
Lymphs Abs: 1.9 10*3/uL (ref 0.7–4.0)
Monocytes Relative: 10 % (ref 3–12)
Neutro Abs: 2 10*3/uL (ref 1.7–7.7)
Neutro Abs: 6 10*3/uL (ref 1.7–7.7)
Neutrophils Relative %: 48 % (ref 43–77)
Neutrophils Relative %: 71 % (ref 43–77)

## 2010-09-23 LAB — CBC
HCT: 28.7 % — ABNORMAL LOW (ref 36.0–46.0)
HCT: 30.4 % — ABNORMAL LOW (ref 36.0–46.0)
Hemoglobin: 13.7 g/dL (ref 12.0–15.0)
Hemoglobin: 9.9 g/dL — ABNORMAL LOW (ref 12.0–15.0)
MCHC: 34.2 g/dL (ref 30.0–36.0)
MCHC: 34.4 g/dL (ref 30.0–36.0)
MCV: 89.9 fL (ref 78.0–100.0)
MCV: 90.7 fL (ref 78.0–100.0)
MCV: 90.8 fL (ref 78.0–100.0)
Platelets: 138 10*3/uL — ABNORMAL LOW (ref 150–400)
Platelets: 158 10*3/uL (ref 150–400)
Platelets: 178 10*3/uL (ref 150–400)
RBC: 3.17 MIL/uL — ABNORMAL LOW (ref 3.87–5.11)
RBC: 4.44 MIL/uL (ref 3.87–5.11)
RDW: 11.5 % (ref 11.5–15.5)
RDW: 12.5 % (ref 11.5–15.5)
WBC: 4 10*3/uL (ref 4.0–10.5)
WBC: 5.9 10*3/uL (ref 4.0–10.5)
WBC: 8.5 10*3/uL (ref 4.0–10.5)

## 2010-09-23 LAB — BASIC METABOLIC PANEL
BUN: 1 mg/dL — ABNORMAL LOW (ref 6–23)
BUN: 11 mg/dL (ref 6–23)
BUN: 6 mg/dL (ref 6–23)
CO2: 27 mEq/L (ref 19–32)
CO2: 29 mEq/L (ref 19–32)
CO2: 30 mEq/L (ref 19–32)
Calcium: 8.3 mg/dL — ABNORMAL LOW (ref 8.4–10.5)
Calcium: 8.7 mg/dL (ref 8.4–10.5)
Chloride: 105 mEq/L (ref 96–112)
Chloride: 105 mEq/L (ref 96–112)
Chloride: 107 mEq/L (ref 96–112)
Creatinine, Ser: 0.75 mg/dL (ref 0.4–1.2)
Creatinine, Ser: 0.99 mg/dL (ref 0.4–1.2)
GFR calc Af Amer: >60 mL/min (ref >60)
GFR calc non Af Amer: >60 mL/min (ref >60)
GFR calc non Af Amer: >60 mL/min (ref >60)
Glucose, Bld: 126 mg/dL — ABNORMAL HIGH (ref 70–99)
Glucose, Bld: 128 mg/dL — ABNORMAL HIGH (ref 70–99)
Potassium: 3.2 mEq/L — ABNORMAL LOW (ref 3.5–5.1)
Potassium: 3.9 mEq/L (ref 3.5–5.1)
Potassium: 4.3 mEq/L (ref 3.5–5.1)
Sodium: 137 mEq/L (ref 135–145)
Sodium: 140 mEq/L (ref 135–145)

## 2010-09-23 LAB — COMPREHENSIVE METABOLIC PANEL
Albumin: 3.8 g/dL (ref 3.5–5.2)
BUN: 13 mg/dL (ref 6–23)
Calcium: 11.1 mg/dL — ABNORMAL HIGH (ref 8.4–10.5)
Chloride: 103 mEq/L (ref 96–112)
GFR calc Af Amer: >60 mL/min (ref >60)
Glucose, Bld: 96 mg/dL (ref 70–99)
Sodium: 140 mEq/L (ref 135–145)
Total Bilirubin: 0.7 mg/dL (ref 0.3–1.2)

## 2010-09-23 LAB — URINALYSIS, ROUTINE W REFLEX MICROSCOPIC
Glucose, UA: NEGATIVE mg/dL
Specific Gravity, Urine: 1.009 (ref 1.005–1.030)
Urobilinogen, UA: 0.2 mg/dL (ref 0.0–1.0)

## 2010-09-23 LAB — TYPE AND SCREEN: Antibody Screen: NEGATIVE

## 2010-09-23 LAB — CA 125: CA 125: 61.4 U/mL — ABNORMAL HIGH (ref 0.0–30.2)

## 2010-09-23 LAB — URINE CULTURE: Culture: NO GROWTH

## 2010-09-23 LAB — URINE MICROSCOPIC-ADD ON

## 2010-09-23 LAB — CEA: CEA: 2 ng/mL (ref 0.0–5.0)

## 2010-10-31 NOTE — Assessment & Plan Note (Signed)
[facility]                                 ON-CALL NOTE   Whitney Meza, Whitney Meza                          MRN:          [id number]  DATE:05/24/2007                            DOB:          1940-11-13    TIME OF CALL:  10:29am.   PRIMARY CARE PHYSICIAN:  Dr. Hetty Ely.   CALLER:  Patient.   TELEPHONE NUMBER:  [phone]   The patient states that earlier this week Dr. Hetty Ely had made  arrangements to meet her at the [facility] office today (being  Saturday), to recheck a dressing on her scalp where he had repaired a  laceration. She has been waiting there for a while and wonders if there  was a mix up. She says that she feels fine and the wound seems to be  healing well. We offered to perform this service for her here in our  Saturday clinic, but she did not want to drive all the way in to see [place]  today. We advised her to contact Dr. Hetty Ely to be seen on Monday. She  can certainly call me back over the weekend if there are further  problems.     Tera Mater. Clent Ridges, MD  Electronically Signed    SAF/MedQ  DD: 05/24/2007  DT: 05/25/2007  Job #: [id number]

## 2010-10-31 NOTE — Discharge Summary (Signed)
NAME:  Whitney Meza, Whitney Meza                 ACCOUNT NO.:  [id number]   MEDICAL RECORD NO.:  [id number]          PATIENT TYPE:  INP   LOCATION:  1538                         FACILITY:  [facility]   PHYSICIAN:  Roseanna Rainbow, M.D.DATE OF BIRTH:  Mar 22, 1941   DATE OF ADMISSION:  02/08/2009  DATE OF DISCHARGE:  02/13/2009                               DISCHARGE SUMMARY   CHIEF COMPLAINT:  The patient is a 70 year old with a pelvic mass who  presents for an exploratory laparotomy and total hysterectomy with  bilateral salpingo-oophorectomy.  Please see the dictated history and  physical.   HOSPITAL COURSE:  Please see the dictated operative summary.  An NG tube  had been placed intraoperatively; this was removed on postoperative day  one.  The patient was mildly dehydrated during the first 24 hours  postoperatively and had secondary low urine output; this responded to a  fluid challenge.  On postoperative day one a hemoglobin was 11.9, a CEA  had been obtained in the PACU and was 2.  There was slow return of bowel  function.  Her diet was gradually advanced.  She was found to be mildly  hypokalemic and this was repleted. The patient also developed a fever on  postoperative day two with a T-max to 102; there was no leukocytosis on  the CBC.  Lung exam and chest x-ray were consistent with atelectasis.  A  urine culture and sensitivity showed no growth.  The patient had been on  cefoxitin perioperatively for 48 hours.  She defervesced and remained  afebrile throughout the remainder of the hospital course.   DISCHARGE DIAGNOSES:  1. 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  2. Procedures:  Supracervical hysterectomy, bilateral salpingo-      oophorectomy, segmental resection of a portion of distal cecum and      ileum with reanastomoses of the terminal ileum to the transverse      colon.   PROCEDURES:  1. Supracervical hysterectomy.  2. Bilateral  salpingo-oophorectomy.  3. Resection of a segment of the distal ileum with anastomosis of the      terminal ileum to the transverse colon.   CONDITION:  Stable.   DIET:  Regular.   ACTIVITY:  Progressive activity.   MEDICATIONS:  1. Darvocet 1 - 2 tablets p.o. every 6 hours as needed.  2. Lovenox 40 mg subcutaneous daily for 3 weeks.  3. Multivitamins.  4. Calcium.  5. Prinivil 20 mg p.o. daily.  6. Crestor 10 mg p.o. nightly.   DISPOSITION:  The patient was to follow up with GYN/Oncology and Medical  Oncology.      Roseanna Rainbow, M.D.  Electronically Signed     LAJ/MEDQ  D:  02/14/2009  T:  02/14/2009  Job:  [id number]   cc:   Telford Nab, R.N.  [address]  [place], [place] [address]   Arta Silence, MD  Fax: [phone]

## 2010-10-31 NOTE — Consult Note (Signed)
NAME:  Whitney Meza, Whitney Meza                 ACCOUNT NO.:  [id number]   MEDICAL RECORD NO.:  [id number]          PATIENT TYPE:  OUT   LOCATION:  GYN                          FACILITY:  [facility]   PHYSICIAN:  Laurette Schimke, MD     DATE OF BIRTH:  18-Apr-1941   DATE OF CONSULTATION:  01/11/2009  DATE OF DISCHARGE:                                 CONSULTATION   REASON FOR CONSULTATION:  Pelvic mass.  Consult was requested by Dr.  Hetty Ely.   HISTORY OF PRESENT ILLNESS:  This is a 70 year old gravida 6, para 2,  last menstrual period in her 4s, and a history of Prempro use beginning  16 years ago.  Currently use is titrated to every other day.  Attempts  to decrease this dosing have been unsuccessful and are associated with  loss of memory and discomfort.  Ms. Prophete denies any abnormal bleeding,  any weight loss, but does report occasional postprandial bloating.  A  pelvic ultrasound obtained on December 14, 2008 was notable for a uterus  measuring 6.6 x 4.3 x 4.9 cm.  No normal right ovary is identified, but  the right pelvic solid mass measuring 4 cm x 4.4 cm was appreciated.  The left ovary was not identified and a small amount of fluid was noted  in the cul-de-sac.  An MRI obtained on December 14, 2008 was notable for  presence of an indeterminate right adnexal mass measuring 4.5 cm.  It  was unclear whether or not this was a broad ligament leiomyoma.  Other  considerations were that of a solid adnexal mass.   PAST MEDICAL HISTORY:  Hypertension.  Hyperlipidemia diagnosed in  January 1995.  Gastroesophageal reflux disease diagnosed in 2002-11-25.  Diverticulosis of the colon diagnosed in 2002/11/25.  Depression diagnosed in  8.   PAST SURGICAL HISTORY:  Tonsillectomy in 11/25/54, septoplasty in 1980s.  Two Cesarean sections.  Negative cardiac catheterization in June 1991.  Back surgery, L4-5 in 11-25-2003.  Right knee arthroscopy in October 2006.   MEDICATIONS:  1. Multivitamins.  2. Calcium.  3. Prinvil 20 mg 1  tablet daily.  4. Crestor 10 mg 1 tablet every night.  5. Activella 0.5 mg 1 tablet daily.   FAMILY HISTORY:  Notable for the absence of any colon, gynecologic, or  breast malignancies.   SOCIAL HISTORY:  Tobacco use.  History of 3 to 4 packs per day for about  33 years.  She stopped smoking in 1987/11/25.  Her husband died of myocardial  infarction in 79.  Her son died of a motor vehicle accident in 6.  She has a daughter who is alive and well and is a smoker.   SCREENING HISTORY:  Mammogram less than a year ago, within normal  limits.  Colonoscopy 2 years ago.  Pap smear December 06, 2008 within normal  limits.   PHYSICAL EXAMINATION:  VITAL SIGNS:  Height 5 feet 2 inches.  Weight 178  pounds.  Blood pressure 130/68, heart rate 80.  CONSTITUTIONAL:  Well-developed female in no acute distress.  NECK:  Normal range of motion.  No masses.  LYMPHATICS:  No cervical, supraclavicular, or inguinal adenopathy.  ABDOMEN:  Soft, obese.  Midline vertical incision.  No fluid wave.  No  palpable omental cake.  HEART:  Regular rate and rhythm.  BACK:  No CVA tenderness.  PELVIC:  The cervix is small, nulliparas in palpation.  Approximately 10  cm solid pelvic mass is appreciated.  It is not mobile.  RECTAL:  Confirms presence of this mass.  There is no nodularity within  the cul-de-sac.  EXTREMITIES:  No cyanosis, clubbing, or edema.   IMPRESSION:  This is a 70 year old with a solid pelvic mass 4.5 cm on  MRI of 1 month ago.  On palpation, more between 7 and 9 cm.  The mass is  not mobile and likely not amenable to removal by laparoscopic approach.   RECOMMENDATIONS:  Exploratory laparotomy and total abdominal  hysterectomy bilateral salpingo-oophorectomy.  If the frozen section at  that time is suggestive of a malignancy, the procedure will be extended  to include omentectomy, lymph node dissection, and debulking.  Bowel  prep will be required.  CA125 will be obtained perioperatively.  The   risks and benefits of surgery were discussed with Ms. Carias.  The plan  for surgical evaluation on this patient is February 08, 2009.      Laurette Schimke, MD  Electronically Signed     WB/MEDQ  D:  01/11/2009  T:  01/11/2009  Job:  [id number]   cc:   Telford Nab, R.N.  [address]  [place], [place] [address]   Arta Silence, MD  Fax: [phone]

## 2010-10-31 NOTE — Op Note (Signed)
NAME:  Whitney Meza, Whitney Meza                 ACCOUNT NO.:  [id number]   MEDICAL RECORD NO.:  [id number]          PATIENT TYPE:  INP   LOCATION:  0006                         FACILITY:  [facility]   PHYSICIAN:  Laurette Schimke, MD     DATE OF BIRTH:  [birth year]   DATE OF PROCEDURE:  02/08/2009  DATE OF DISCHARGE:                               OPERATIVE REPORT   PREOPERATIVE DIAGNOSES:  Pelvic mass, elevated CA-125.   POSTOPERATIVE DIAGNOSES:  Adenocarcinoma with signet-ring features,  likely appendiceal primary.   SURGEON:  Laurette Schimke, MD.   ASSISTANT SURGEON:  Roseanna Rainbow, M.D. and Telford Nab,  R.N.   INDICATIONS FOR PROCEDURE:  This is a 70 year old who presented with  complaints of abdominal bloating and postprandial discomfort.  MRI  findings were notable for a solid right adnexal mass.  Ultrasound was  notable for a right adnexal mass and a small amount of fluid within the  pelvis.  The CA-125 returned a value of 61.2.  She was counseled and the  possibility of this being a GYN malignancy.  Of note, she had a  colonoscopy 2 years prior that was within normal limits.   PLANNED PROCEDURE:  Supracervical hysterectomy, bilateral salpingo-  oophorectomy, omentectomy, resection of distal ileum and cecum with  ileotransverse enterocolostomy with staging and debulking as indicated.   FINDINGS:  Upon entrance into the abdomen there was a  large amount of  ascites approximately 3 L.  A solid right adnexal mass was appreciated  in continuity with the cecum and redundant ileum which was also  attached.  The appendix could not be clearly identified.  The left  adnexa appeared small and uterus was very hard, almost calcified.  The  bladder was markedly adherent to the anterior uterus and the cul-de-sac.  The rectosigmoid showed evidence of diverticular inflammation.  A 4 cm  disease was noted in the omentum.  No liver hemostasis were palpated.   DESCRIPTION OF PROCEDURE:  The  patient was taken to the operating room  and placed under general endotracheal  anesthesia without any  difficulty.  A midline incision was made with return of ascites.  The  omental nodule was appreciated.  An incision was extended above the  umbilicus.  The cecum and rectosigmoid were noted to be adherent to the  right adnexa.  The retroperitoneal space was entered.  The ureter was  identified and the structures overlying the ureters were transected as  they were presumed to be the utero-ovarian ligament.  The peritoneum  anteriorly was dissected to free it from the uterus which was very  unusually hard.  After incision in the peritoneum the bladder was  appreciated.  The bladder was dissected to the level of the proximal  cervix.  There were dense adhesions between the bladder, the lower  uterine segment and the cervix that did not facilitate removal of the  cervix.  The ileum was sharply dissected off the rectosigmoid and the  left retroperitoneal space was entered.  The left ureter and  infundibulopelvic ligament was identified.  The infundibulopelvic  ligament was  transected and the uterus was dissected free from its  pelvic wall attachments.  Similarly, on the left the uterus was  dissected free from the ureter and the pelvic sidewall.  A 75 GIA  stapler was placed over the cecum and over the ilium.  The utero-ovarian  ligament was transected and the entire specimen sent to pathology.  Supracervical hysterectomy was then performed with use of Bovie cautery.  The abdomen and pelvis were copiously irrigated and drained.  The  omentum was resected with use of the LigaSure.  The stomach was palpated  and no masses were noted within the stomach.  The distal ileum was then  approximated to the proximal aspect of the transverse colon.  Interrupted sutures were placed to stabilize these two structures and  incisions made in the antimesenteric aspect of both.  The GIA stapler  was inserted  and communication created.  The TA stapler was then placed  over the lumen that was created.  Interrupted silk sutures were used to  close the peritoneal defect.  The abdomen was irrigated and drained and  the cautery again applied over the cervix where areas were noted to be  bleeding.  The general surgeon on-call was informed of our findings and  given the findings, he felt that the pathology would likely confirm that  this was a stage IV appendiceal cancer.  At the completion of procedure  only miliary disease was noted on the small bowel and the peritoneum.  The small bowel was run and no evidence of obstruction was appreciated.  The abdomen and pelvis were copiously irrigated and drained.  The fascia  was closed in a mass suture with the use of zero loop PDS overlapping in  the midline.  The subcutaneous tissues were copiously irrigated and  drained and the 2-0 Vicryl was used to approximate the subcutaneous  tissues.  A subcuticular 4-0 Vicryl sutures were used to close the  incision.   ESTIMATED BLOOD LOSS:  250 mL.   Sponge, needle and instrument counts correct x3.   DRAINS:  Foley draining clear urine and NG tube to low continuous  suction.   DISPOSITION:  The patient was extubated and taken to the recovery room  in stable condition.      Laurette Schimke, MD  Electronically Signed     WB/MEDQ  D:  02/08/2009  T:  02/08/2009  Job:  [id number]   cc:   Roseanna Rainbow, M.D.  Fax: [phone]   Telford Nab, R.N.  [address]  [place], [place] [address]   Arta Silence, MD  Fax: [phone]

## 2010-11-03 NOTE — H&P (Signed)
NAME:  Whitney Meza, Whitney Meza                 ACCOUNT NO.:  [id number]   MEDICAL RECORD NO.:  [id number]          PATIENT TYPE:  AMB   LOCATION:  DAY                          FACILITY:  [facility]   PHYSICIAN:  Ronald A. Gioffre, M.D.DATE OF BIRTH:  [birth year]   DATE OF ADMISSION:  DATE OF DISCHARGE:                                HISTORY & PHYSICAL   HISTORY:  Patient has had back pain radiating into her left lower extremity  for the past month.  She has had increasing pain.  She feels like her leg is  weak at times.  Patient was evaluated in the office by Dr. Darrelyn Hillock.  An MRI  was ordered, which revealed a large disk rupture at L4-5 on the left.  Patient is scheduled for surgery for microdiskectomy.   ALLERGIES:  SULFA.   Patient's primary care Ji Feldner is Dr. Hetty Ely, [place] at [facility].   CURRENT MEDICATIONS:  1.  Prempro 2.5 mg daily.  2.  Acyclovir 400 mg daily.  3.  Combunox 400 mg twice daily as needed for pain.  4.  Prevacid 30 mg daily.  5.  Mobic 15 mg daily.   PAST MEDICAL HISTORY:  Patient has a history of ocular herpes, hiatal  hernia, gastroesophageal reflux disease, arthritis.   PAST SURGICAL HISTORY:  Patient had a C-section in 1970.  Another C-section  in 1973.  Nasal surgery.  Tonsillectomy in 1954.  D&C in 1967.   FAMILY HISTORY:  Not available.   REVIEW OF SYSTEMS:  GENERAL:  Denies weight change, fever, chills, fatigue.  HEENT:  Denies headache, visual changes, tinnitus, hearing loss, sore  throat.  CARDIOVASCULAR:  Denies chest pain, shortness of breath,  palpitations, orthopnea.  PULMONARY:  Denies dyspnea, wheezing, cough,  sputum production, hemoptysis.  GI:  Denies dysphagia, nausea, vomiting,  hematemesis, or abdominal pain.  GU:  Denies dysuria, frequency, urgency, or  hematuria.  ENDOCRINE:  Denies polyuria, polydipsia, appetite change, heat  or cold intolerance.  MUSCULOSKELETAL:  Patient has pain in her low back  that radiates into her left lower  extremity.   PHYSICAL EXAMINATION:  VITAL SIGNS:  Temperature is 98.1, pulse 72,  respirations 18, blood pressure 120/84 right arm sitting.  GENERAL:  A 70 year old female in no acute distress.  HEENT:  PERRL.  EOMs are intact.  Pharynx clear.  NECK:  Supple without masses.  LUNGS:  Clear to auscultation bilaterally.  She did have rhonchi, heard best  in the posterior chest, right side, upper lobe, which cleared with a cough.  HEART:  Regular rate and rhythm without murmur.  ABDOMEN:  Positive bowel sounds.  Soft and nontender.  No organomegaly or  abnormal masses.  MUSCULOSKELETAL:  Examination of her back reveals some pain with range of  motion that radiates to her left lower extremity.  Her strength is normal.  She has no weakness.  She has decreased reflex in the left lower extremity.  SKIN:  Warm and dry.   MRI of her lumbar spine revealed a large disk herniation in L4-5 on the  left.   IMPRESSION:  Large  disk herniation, L4-5, left.   PLAN:  Patient is to be admitted to [facility] on March 20, 2004  to undergo microdiskectomy of L4-5 on the left.      LKP/MEDQ  D:  03/16/2004  T:  03/16/2004  Job:  [id number]

## 2010-11-03 NOTE — Op Note (Signed)
NAME:  Whitney Meza, Whitney Meza                 ACCOUNT NO.:  [id number]   MEDICAL RECORD NO.:  [id number]          PATIENT TYPE:  OBV   LOCATION:  0442                         FACILITY:  [facility]   PHYSICIAN:  Georges Lynch. Gioffre, M.D.DATE OF BIRTH:  12-Jul-1940   DATE OF PROCEDURE:  03/20/2004  DATE OF DISCHARGE:                                 OPERATIVE REPORT   SURGEON:  Dr. Darrelyn Hillock   ASSISTANT:  Dr. Ronnell Guadalajara   PREOPERATIVE DIAGNOSES:  1.  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  2.  Herniated lumbar disk L4-5 on the left with a foraminal component.   POSTOPERATIVE DIAGNOSES:  1.  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  2.  Herniated lumbar disk L4-5 on the left with a foraminal component.   OPERATION:  1.  Decompression lateral recess at L4-5 on the left with a partial      facetectomy.  2.  Microdiskectomy L4-5 on the left.  3.  Foraminotomy L4-5 on the left.   DESCRIPTION OF PROCEDURE:  Under general anesthesia, a routine orthopedic  prep and draping of the lower back was carried out.  She was given 1 g of IV  Ancef preop.  At this time, after sterile prep and drapings were carried  out, I then inserted two spinal needles.  An x-ray was taken there for  marking purposes only.  At this time, we then made an incision over L4-5  space, bleeders identified and cauterized.  The self-retaining retractors  were inserted after we separated the muscle from the lamina and spinous  process.  At this time, another x-ray was taken to verify our position at 4-  5 on the left.  We then utilized the bur to bur down the lateral recess in  order to thin out the lamina.  Following this, we then carried out a  hemilaminotomy, went out and did a partial facetectomy.  The ligamentum  flavum was very thickened at this area.  We thoroughly removed that with  great care taken to protect the underlying dura, and we then went on and  decompressed the lateral recess and went out far laterally in  order to have  access to the foraminal component of the disk herniation.  We then  identified the nerve root first at the L5 root and the 4 root above.  We  then cauterized the lateral recess veins with a bipolar.  We then made a  cruciate incision into the disk space and went down and did microdiskectomy.  We utilized the nerve hook and the Epstein curettes to go out laterally and  also subligamentous in both directions.  We thoroughly decompressed the  root, the 4 root and the 5 root.  We were then able to easily pass a hockey-  stick out the foramina for both roots.  Following this, we then went across  the midline to make sure there were no other disk fragments.  There were  not.  We made multiple passes into the disk space.  Great care was taken to  make sure we  had good hemostasis.  We thoroughly irrigated out the  wound and then loosely applied some thrombin-soaked Gelfoam and closed the  wound in layers in the usual fashion.  I left a portion of the superior deep  portion of the wound open for drainage purposes as well.  The skin was  closed with metal staples, and sterile dressings were applied.      RAG/MEDQ  D:  03/20/2004  T:  03/20/2004  Job:  [id number]

## 2010-11-06 ENCOUNTER — Other Ambulatory Visit: Payer: Self-pay | Admitting: *Deleted

## 2010-11-06 MED ORDER — LISINOPRIL 20 MG PO TABS: 20.0000 mg | ORAL_TABLET | Freq: Every day | ORAL | Status: AC

## 2011-01-03 ENCOUNTER — Encounter: Payer: Self-pay | Admitting: Family Medicine

## 2011-01-04 ENCOUNTER — Ambulatory Visit (INDEPENDENT_AMBULATORY_CARE_PROVIDER_SITE_OTHER): Payer: 59 | Admitting: Family Medicine

## 2011-01-04 ENCOUNTER — Encounter: Payer: Self-pay | Admitting: Family Medicine

## 2011-01-04 DIAGNOSIS — J069 Acute upper respiratory infection, unspecified: Secondary | ICD-10-CM | POA: Insufficient documentation

## 2011-01-04 MED ORDER — AZITHROMYCIN 250 MG PO TABS: ORAL_TABLET | ORAL | Status: AC

## 2011-01-04 MED ORDER — GUAIFENESIN-CODEINE 100-10 MG/5ML PO SYRP: 5.0000 mL | ORAL_SOLUTION | Freq: Every evening | ORAL | Status: AC | PRN

## 2011-01-04 NOTE — Assessment & Plan Note (Signed)
Going on almost 1 wk, in setting of recent chemo however no true fever. Treat with zpack to cover atypicals, bacterial. Update [place] if not worsening and to call cancer docs if true fever. Sent in cheratussin for cough at night.

## 2011-01-04 NOTE — Progress Notes (Signed)
  Subjective:    Patient ID: Whitney Meza, female    DOB: [birth year], 70 y.o.   MRN: [id number]  HPI CC: ST/sinuses acting up  On chemo for presumed colon cancer that has spread, sees [facility] for this, last chemo session was Tuesday (2d ago).  To return Aug 3rd, start IV chemo.  H/o cancer x 2 years, seems to have come back.  Thursday (7d ago) started feeling bad.  + sinus congestion, ST, coughing that started bringing up sputum yesterday.  Temp max 100.1.  No more fever since yesterday (99.1).  Very tired.  Hoarse voice.  Has tried OTC congestion relief as well as plenty of fluid.  + loose stools.  Has ostomy bag.  + sore neck muscles and mild headache.  Cough keeping her up at night.  No abd pain, n/v, HA, confusion, rashes.  No sick contacts around her.  Review of Systems Per HPI    Objective:   Physical Exam  Nursing note and vitals reviewed. Constitutional: She appears well-developed and well-nourished. No distress.  HENT:  Head: Normocephalic and atraumatic.  Right Ear: Hearing, tympanic membrane, external ear and ear canal normal.  Left Ear: Hearing, tympanic membrane, external ear and ear canal normal.  Nose: Nose normal. No mucosal edema or rhinorrhea. Right sinus exhibits no maxillary sinus tenderness and no frontal sinus tenderness. Left sinus exhibits no maxillary sinus tenderness and no frontal sinus tenderness.  Mouth/Throat: Uvula is midline, oropharynx is clear and moist and mucous membranes are normal. No oropharyngeal exudate, posterior oropharyngeal edema, posterior oropharyngeal erythema or tonsillar abscesses.  Eyes: Conjunctivae and EOM are normal. Pupils are equal, round, and reactive to light. No scleral icterus.  Neck: Normal range of motion. Neck supple.  Cardiovascular: Normal rate, regular rhythm, normal heart sounds and intact distal pulses.   No murmur heard. Pulmonary/Chest: Effort normal and breath sounds normal. No respiratory distress. She has no wheezes.  She has no rales.       No crackesl  Musculoskeletal: She exhibits no edema.  Lymphadenopathy:    She has no cervical adenopathy.  Skin: Skin is warm and dry. No rash noted.  Psychiatric: She has a normal mood and affect.          Assessment & Plan:

## 2011-01-04 NOTE — Patient Instructions (Signed)
Sounds like you have a upper respiratory infection.  Will treat with zpack  Use medication as prescribed: zpack, may try cheratussin for cough at night. Push fluids and plenty of rest. Use simple mucinex with plenty of fluid to help mobilize mucous. Please return if you are not improving as expected, or if you have high fevers (>101.5) or difficulty swallowing or worsening productive cough. Call clinic with questions.  Pleasure to see you today.

## 2011-07-02 ENCOUNTER — Encounter: Payer: Self-pay | Admitting: Family Medicine

## 2011-07-23 ENCOUNTER — Encounter: Payer: Self-pay | Admitting: Family Medicine

## 2011-08-22 ENCOUNTER — Encounter: Payer: Self-pay | Admitting: Family Medicine

## 2011-09-10 ENCOUNTER — Encounter: Payer: Self-pay | Admitting: Family Medicine

## 2011-09-13 ENCOUNTER — Encounter: Payer: Self-pay | Admitting: *Deleted

## 2011-09-24 ENCOUNTER — Encounter: Payer: Self-pay | Admitting: Family Medicine

## 2011-10-17 HISTORY — PX: COLONOSCOPY: SHX174

## 2011-10-17 HISTORY — PX: ESOPHAGOGASTRODUODENOSCOPY: SHX1529

## 2011-11-12 ENCOUNTER — Encounter: Payer: Self-pay | Admitting: Family Medicine

## 2012-04-21 ENCOUNTER — Encounter: Payer: Self-pay | Admitting: Family Medicine

## 2012-04-21 ENCOUNTER — Ambulatory Visit (INDEPENDENT_AMBULATORY_CARE_PROVIDER_SITE_OTHER)
Admission: RE | Admit: 2012-04-21 | Discharge: 2012-04-21 | Disposition: A | Discharge status: Home / Self Care | Payer: Medicare Other | Source: Ambulatory Visit | Attending: Family Medicine | Admitting: Family Medicine

## 2012-04-21 ENCOUNTER — Ambulatory Visit (INDEPENDENT_AMBULATORY_CARE_PROVIDER_SITE_OTHER): Payer: Medicare Other | Admitting: Family Medicine

## 2012-04-21 VITALS — BP 164/84 | HR 100 | Temp 100.6°F | Wt 164.2 lb

## 2012-04-21 DIAGNOSIS — R509 Fever, unspecified: Secondary | ICD-10-CM

## 2012-04-21 DIAGNOSIS — D849 Immunodeficiency, unspecified: Secondary | ICD-10-CM

## 2012-04-21 DIAGNOSIS — Z8701 Personal history of pneumonia (recurrent): Secondary | ICD-10-CM | POA: Insufficient documentation

## 2012-04-21 DIAGNOSIS — J189 Pneumonia, unspecified organism: Secondary | ICD-10-CM

## 2012-04-21 LAB — CBC WITH DIFFERENTIAL/PLATELET
Basophils Absolute: 0 10*3/uL (ref 0.0–0.1)
Eosinophils Absolute: 0 10*3/uL (ref 0.0–0.7)
HCT: 33.6 % — ABNORMAL LOW (ref 36.0–46.0)
Hemoglobin: 10.8 g/dL — ABNORMAL LOW (ref 12.0–15.0)
Lymphs Abs: 0.8 10*3/uL (ref 0.7–4.0)
MCHC: 32 g/dL (ref 30.0–36.0)
Monocytes Absolute: 0.6 10*3/uL (ref 0.1–1.0)
Neutro Abs: 2.3 10*3/uL (ref 1.4–7.7)
RDW: 21.3 % — ABNORMAL HIGH (ref 11.5–14.6)

## 2012-04-21 MED ORDER — LEVOFLOXACIN 500 MG PO TABS: 500.0000 mg | ORAL_TABLET | Freq: Every day | ORAL | Status: DC

## 2012-04-21 NOTE — Assessment & Plan Note (Addendum)
In immunocompromised patient (on chemo for colon cancer), lungs sounding overall clear but given compromised status and low O2, checked CXR today - concern for RLL PNA. Treat with levaquin 500mg  daily x 10 days Discussed red flags to seek urgent care, if continued fever despite levaquin, to seek care at ER or return to [facility]. Nontoxic today, O2 sat 93%.  Deemed ok for outpt management. Discussed importance of avoiding sick contacts. Check CBC today as well as Cr as starting levaquin.

## 2012-04-21 NOTE — Progress Notes (Addendum)
  Subjective:    Patient ID: Whitney Meza, female    DOB: September 05, 1940, 71 y.o.   MRN: [id number]  HPI CC: cold?  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  H/o cancer x 2 years, seems to have come back.  Planning on returning next Monday to [facility] for f/u CT scan.  On avastin.  Not seen here since 2012.  sxs started 2 wks ago, even prior to chemo - malaise and diarrhea.  Has been fatigued, nauseated, vomiting.  In bed all weekend.  + chills.  Tmax 100-102.9 over the weekend.  Progressively worsening.  + HA, sinus congestion, dry cough attributed to mild PNDrainage.  Doesn't know last WBC count.  Decreased appetite.  Drinking plenty of gatorade.  + sick contacts recently.  Has not been [employer]. Hasn't tried anything other than tylenol. No h/o asthma, COPD.  No ear or tooth pain, abd pain.  No vomiting since Saturday.    H/o ostomy bag. No recent abx use.  Last abx were 10/2011.  Past Medical History  Diagnosis Date  . Depression 12/17/89  . Diverticulosis of colon 1/04  . GERD (gastroesophageal reflux disease) 1/04  . HLD (hyperlipidemia) 06/1993  . Hyperthyroidism   . 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 Followed by [facility]  . Colostomy in place   . Colovesical fistula   . HTN (hypertension)   . Arthritis   . Ocular herpes simplex     on valcyte  . Macular degeneration      Review of Systems Per HPI    Objective:   Physical Exam  Nursing note and vitals reviewed. Constitutional: She appears well-developed and well-nourished. No distress.  HENT:  Head: Normocephalic and atraumatic.  Right Ear: Hearing, tympanic membrane, external ear and ear canal normal.  Left Ear: Hearing, tympanic membrane, external ear and ear canal normal.  Nose: Mucosal edema present. No rhinorrhea. Right sinus exhibits no  maxillary sinus tenderness and no frontal sinus tenderness. Left sinus exhibits no maxillary sinus tenderness and no frontal sinus tenderness.  Mouth/Throat: Uvula is midline, oropharynx is clear and moist and mucous membranes are normal. No oropharyngeal exudate, posterior oropharyngeal edema, posterior oropharyngeal erythema or tonsillar abscesses.  Eyes: Conjunctivae normal and EOM are normal. Pupils are equal, round, and reactive to light. No scleral icterus.       residual scarring L pupil after zoster  Neck: Normal range of motion. Neck supple.  Cardiovascular: Normal rate, regular rhythm, normal heart sounds and intact distal pulses.   No murmur heard. Pulmonary/Chest: Effort normal and breath sounds normal. No respiratory distress. She has no wheezes. She has no rales.       Bibasilar crackles  Abdominal: Soft. Bowel sounds are normal. She exhibits no distension and no mass. There is no tenderness. There is no rebound and no guarding.       Stoma LLQ, dressing c/d/i  Lymphadenopathy:    She has no cervical adenopathy.  Skin: Skin is warm and dry. No rash noted.       Assessment & Plan:

## 2012-04-21 NOTE — Patient Instructions (Addendum)
Concern for pneumonia. Treat with levaquin once daily for 10 days. If fever continued or any worsening, please call us or go to ER.

## 2012-04-22 LAB — BASIC METABOLIC PANEL
Calcium: 9.8 mg/dL (ref 8.4–10.5)
Creatinine, Ser: 1 mg/dL (ref 0.4–1.2)

## 2012-04-25 ENCOUNTER — Telehealth: Payer: Self-pay

## 2012-04-25 NOTE — Telephone Encounter (Signed)
Pt said she is feeling a little better but she doesn't have her car to get to an appt today, pt scheduled appt with you on Monday 04/28/12, I advise pt if her sxs get worse over weekend she needs to go to the ER or UC

## 2012-04-25 NOTE — Telephone Encounter (Signed)
She needs to be seen by first available please this afternoon

## 2012-04-25 NOTE — Telephone Encounter (Signed)
appt scheduled for Sat. Clinic and your appt for 04/28/12 was canceled

## 2012-04-25 NOTE — Telephone Encounter (Signed)
Yes- she is on chemo so I don't want to take any chances - perhaps she could go to the Saturday clinic tomorrow -- please call and see if this is possible - I would love her not to wait until Monday if possible.Marland Kitchen

## 2012-04-25 NOTE — Telephone Encounter (Signed)
Pt seen 04/21/12 has pneumonia;fever  Averaging 99 since 04/22/12;severe S/T started 04/21/12 after starting antibiotic. Now throat so sore could not sleep last night.productive cough with thick greenish yellow phlegm.[place].Please advise.

## 2012-04-26 ENCOUNTER — Encounter: Payer: Self-pay | Admitting: Family Medicine

## 2012-04-26 ENCOUNTER — Ambulatory Visit (INDEPENDENT_AMBULATORY_CARE_PROVIDER_SITE_OTHER): Payer: Medicare Other | Admitting: Family Medicine

## 2012-04-26 ENCOUNTER — Ambulatory Visit: Payer: Medicare Other | Admitting: Family Medicine

## 2012-04-26 VITALS — BP 142/78 | HR 94 | Temp 97.4°F | Wt 164.0 lb

## 2012-04-26 DIAGNOSIS — J189 Pneumonia, unspecified organism: Secondary | ICD-10-CM

## 2012-04-26 MED ORDER — GUAIFENESIN-CODEINE 100-10 MG/5ML PO SYRP: 10.0000 mL | ORAL_SOLUTION | Freq: Three times a day (TID) | ORAL | Status: DC | PRN

## 2012-04-26 NOTE — Progress Notes (Signed)
  Subjective:    Patient ID: Whitney Meza, female    DOB: [birth year], 71 y.o.   MRN: [id number]  HPI PNA- dx'd on Monday by PCP.  Started on Levaquin.  Pt reports persistent cough.  Pt reports intermittent low grade fevers at home.  Feeling better than she did on Monday.  Having difficulty sleeping due to sore throat.  Now having productive cough.   Review of Systems For ROS see HPI     Objective:   Physical Exam  Vitals reviewed. Constitutional: She appears well-developed and well-nourished. No distress.  HENT:  Head: Normocephalic and atraumatic.       TMs normal bilaterally Mild nasal congestion Throat w/out erythema, edema, or exudate  Eyes: Conjunctivae normal and EOM are normal. Pupils are equal, round, and reactive to light.  Neck: Normal range of motion. Neck supple.  Cardiovascular: Normal rate, regular rhythm, normal heart sounds and intact distal pulses.   No murmur heard. Pulmonary/Chest: Effort normal. No respiratory distress. She has no wheezes.       + hacking cough Coarse BS at bases bilaterally  Lymphadenopathy:    She has no cervical adenopathy.  Skin: Skin is warm and dry.          Assessment & Plan:

## 2012-04-26 NOTE — Assessment & Plan Note (Signed)
New to provider.  Agree w/ abx choice by PCP and pt is clinically well appearing and reports she is improving.  Reassured her that productive cough is normal and she needs to complete abx.  Cough med given.  Reviewed supportive care and red flags that should prompt return.  Pt expressed understanding and is in agreement w/ plan.

## 2012-04-26 NOTE — Patient Instructions (Addendum)
Continue the Levaquin as prescribed by Dr Reece Agar Add the cough syrup as needed- this may make you sleepy Drink plenty of fluids Tylenol or ibuprofen as needed for fevers or pain REST! Hang in there!

## 2012-04-28 ENCOUNTER — Ambulatory Visit: Payer: Medicare Other | Admitting: Family Medicine

## 2012-05-01 ENCOUNTER — Encounter: Payer: Self-pay | Admitting: Family Medicine

## 2012-05-01 ENCOUNTER — Ambulatory Visit (INDEPENDENT_AMBULATORY_CARE_PROVIDER_SITE_OTHER): Payer: Medicare Other | Admitting: Family Medicine

## 2012-05-01 VITALS — BP 148/80 | HR 100 | Temp 98.2°F | Ht 63.25 in | Wt 165.0 lb

## 2012-05-01 DIAGNOSIS — J189 Pneumonia, unspecified organism: Secondary | ICD-10-CM

## 2012-05-01 NOTE — Patient Instructions (Addendum)
Return in 2 months for medicare wellness visit - we will recheck chest xray at that visit. Bring medicare wellness form to that appointment. I'm glad you're feeling better. Be careful staying around too many people as you are immunocompromised from the chemo and the cancer!

## 2012-05-01 NOTE — Progress Notes (Signed)
  Subjective:    Patient ID: Whitney Meza, female    DOB: 1941/05/11, 71 y.o.   MRN: [id number]  HPI CC: f/u PNA  Presents today for f/u appt (actually scheduled for physical, but desires to postpone this given recent RLL PNA).  Recent RLL PNA 04/21/2012 treated with levaquin for 10 days.  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 H/o cancer x 2 years, seems to have come back. Planning on returning next Monday to [facility] for f/u CT scan. On avastin.  Last chemo 04/14/2012.  Continues coughing but overall improved, less productive.  Only mild SOB currently.  Completed levoflox course for 10 days.  Sore in abd from coughing.  Wonders about UTI.  Drinking water and cranberry juice.  No fevers/chills.  Still with fatigue but improving.  No appetite.  Denies dysuria, urgency, frequency, flank pain.  Declines UA today - states will be checked on Monday.  Past Medical History  Diagnosis Date  . Depression 12/17/89  . Diverticulosis of colon 1/04  . GERD (gastroesophageal reflux disease) 1/04  . HLD (hyperlipidemia) 06/1993  . Hyperthyroidism   . 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 Followed by [facility]  . Colostomy in place   . Colovesical fistula   . HTN (hypertension)   . Arthritis   . 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 Macular degeneration     Review of Systems Per HPI    Objective:   Physical Exam  Nursing note and vitals reviewed. Constitutional: She appears well-developed and well-nourished. No distress.  HENT:  Mouth/Throat: Oropharynx is clear and moist. No oropharyngeal exudate.  Cardiovascular: Normal rate, regular rhythm, normal heart sounds and intact distal pulses.   No murmur heard. Pulmonary/Chest: Effort normal. No respiratory distress. She has no wheezes. She has rales (RLL  rales).  Musculoskeletal: She exhibits no edema.  Skin: Skin is warm and dry. No rash noted.       Assessment & Plan:

## 2012-05-01 NOTE — Assessment & Plan Note (Signed)
Overall improving.  Completed 10 d levaquin course yesterday Reviewed blood work in detail.   Discussed importance of avoiding lots of ppl 2/2 immunocompromised status. Discussed red flags to return for further care.

## 2012-05-08 ENCOUNTER — Encounter: Payer: Self-pay | Admitting: Family Medicine

## 2012-05-08 ENCOUNTER — Ambulatory Visit (INDEPENDENT_AMBULATORY_CARE_PROVIDER_SITE_OTHER): Payer: Medicare Other | Admitting: Family Medicine

## 2012-05-08 VITALS — BP 136/84 | HR 78 | Temp 98.1°F | Wt 163.2 lb

## 2012-05-08 DIAGNOSIS — J189 Pneumonia, unspecified organism: Secondary | ICD-10-CM

## 2012-05-08 NOTE — Progress Notes (Addendum)
  Subjective:    Patient ID: Whitney Meza, female    DOB: 10/24/1940, 71 y.o.   MRN: [id number]  HPI CC: recheck PNA.  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 H/o cancer x 2 years, seems to have come back. On avastin. Last chemo 04/14/2012.  Seen Monday for CT scan at [facility] for f/u colon cancer.  Told had continued pneumonia, rec f/u with PCP for this.  No CT report available.  Pt states she will bring in later today or tomorrow.  Having abdominal soreness with coughing.  Has been using stool softener which helps bowels.  Today is best day she's felt.  No need for cough med today.  Decreasing cough, no SOB, no fevers/chills.    Wt Readings from Last 3 Encounters:  05/08/12 163 lb 4 oz (74.05 kg)  05/01/12 165 lb (74.844 kg)  04/26/12 164 lb (74.39 kg)   Past Medical History  Diagnosis Date  . Depression 12/17/89  . Diverticulosis of colon 1/04  . GERD (gastroesophageal reflux disease) 1/04  . HLD (hyperlipidemia) 06/1993  . Hyperthyroidism   . 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 Followed by [facility]  . Colostomy in place   . Colovesical fistula   . HTN (hypertension)   . Arthritis   . 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 Macular degeneration      Review of Systems Per HPI    Objective:   Physical Exam  Nursing note and vitals reviewed. Constitutional: She appears well-developed and well-nourished. No distress.  HENT:  Mouth/Throat: Oropharynx is clear and moist. No oropharyngeal exudate.  Eyes: Conjunctivae normal and EOM are normal. Pupils are equal, round, and reactive to light.  Cardiovascular: Normal rate, regular rhythm, normal heart sounds and intact distal pulses.   No murmur heard. Pulmonary/Chest: Effort normal. No respiratory distress. She has no wheezes. She has  rales (mild residual rales RLL).  Musculoskeletal: She exhibits no edema.       Assessment & Plan:  ADDENDUM ==> reviewed note from [facility] including CT chest - new consolidation RLL consistent with PNA.  rec r/u with PCP if not improving.  Pt is improving.

## 2012-05-08 NOTE — Assessment & Plan Note (Signed)
1 wk since finished levaquin 500mg  10 day course. Each day improving, states today is best she's felt.  slowly decreasing cough. I do not have results of CT scan currently, but would expect to still see continued pneumonia present. Good O2 sat today, not febrile, vitals stable, clinically improving.  I don't think she needs further antibiotics currently. Did discuss red flags to notify us for extension of abx - pt expressed understanding. I did recommend re imaging 6 wks out from PNA, whether that's here with CXR or at [facility] with re staging CT scan, either way would be fine.

## 2012-05-08 NOTE — Patient Instructions (Signed)
Ok to take stool softener daily to help constipation. I do think we're doing well from pnuemonia standpoint.   Please let me know if any fever or worsening cough or just not picking up as expected.  - would want to start antibiotics again if this is the case. Good to see you, call us with questions.

## 2012-05-12 ENCOUNTER — Telehealth: Payer: Self-pay

## 2012-05-12 DIAGNOSIS — J189 Pneumonia, unspecified organism: Secondary | ICD-10-CM

## 2012-05-12 NOTE — Telephone Encounter (Signed)
Plz notify I did receive records from [facility] as well as CT scan that pt dropped off so thanks. Ok to do here.  I've placed future order for CXR. I want this done 6 wks out from PNA, or the week of 06/02/2012, as will take at least that long for CXR changes to resolve.

## 2012-05-12 NOTE — Telephone Encounter (Signed)
Patient notified

## 2012-05-12 NOTE — Telephone Encounter (Signed)
Pt seen 05/08/12; pt said cancer dr wanted Dr Reece Agar to do chest xray to f/u pneumonia.Please advise.

## 2012-05-12 NOTE — Telephone Encounter (Signed)
Patient called and said that her daughter said the cancer did want her to have another chest xray. She said she was mistaken at her appt with you the other day. Can you order this for her to come in and have it done or will you need to see her again in the office?

## 2012-06-02 ENCOUNTER — Ambulatory Visit (INDEPENDENT_AMBULATORY_CARE_PROVIDER_SITE_OTHER)
Admission: RE | Admit: 2012-06-02 | Discharge: 2012-06-02 | Disposition: A | Discharge status: Home / Self Care | Payer: Medicare Other | Source: Ambulatory Visit | Attending: Family Medicine | Admitting: Family Medicine

## 2012-06-02 DIAGNOSIS — J189 Pneumonia, unspecified organism: Secondary | ICD-10-CM

## 2012-06-24 ENCOUNTER — Encounter: Payer: Self-pay | Admitting: Family Medicine

## 2012-06-24 ENCOUNTER — Other Ambulatory Visit: Payer: Self-pay | Admitting: Family Medicine

## 2012-06-24 DIAGNOSIS — I10 Essential (primary) hypertension: Secondary | ICD-10-CM

## 2012-06-24 DIAGNOSIS — C181 Malignant neoplasm of appendix: Secondary | ICD-10-CM

## 2012-06-24 DIAGNOSIS — E785 Hyperlipidemia, unspecified: Secondary | ICD-10-CM

## 2012-06-24 DIAGNOSIS — K219 Gastro-esophageal reflux disease without esophagitis: Secondary | ICD-10-CM

## 2012-06-25 ENCOUNTER — Other Ambulatory Visit (INDEPENDENT_AMBULATORY_CARE_PROVIDER_SITE_OTHER): Payer: Medicare Other

## 2012-06-25 DIAGNOSIS — E785 Hyperlipidemia, unspecified: Secondary | ICD-10-CM

## 2012-06-25 DIAGNOSIS — Z23 Encounter for immunization: Secondary | ICD-10-CM

## 2012-06-25 DIAGNOSIS — I10 Essential (primary) hypertension: Secondary | ICD-10-CM

## 2012-06-25 DIAGNOSIS — C181 Malignant neoplasm of appendix: Secondary | ICD-10-CM

## 2012-06-25 LAB — CBC WITH DIFFERENTIAL/PLATELET
Basophils Absolute: 0 10*3/uL (ref 0.0–0.1)
Basophils Relative: 1.1 % (ref 0.0–3.0)
Eosinophils Absolute: 0.2 10*3/uL (ref 0.0–0.7)
HCT: 30.7 % — ABNORMAL LOW (ref 36.0–46.0)
Hemoglobin: 10.4 g/dL — ABNORMAL LOW (ref 12.0–15.0)
Lymphocytes Relative: 39.1 % (ref 12.0–46.0)
Lymphs Abs: 1.1 10*3/uL (ref 0.7–4.0)
MCHC: 33.9 g/dL (ref 30.0–36.0)
MCV: 79.2 fl (ref 78.0–100.0)
Neutro Abs: 1.1 10*3/uL — ABNORMAL LOW (ref 1.4–7.7)
RBC: 3.87 Mil/uL (ref 3.87–5.11)
RDW: 19.9 % — ABNORMAL HIGH (ref 11.5–14.6)

## 2012-06-25 LAB — LIPID PANEL
Cholesterol: 205 mg/dL — ABNORMAL HIGH (ref 0–200)
HDL: 33.2 mg/dL — ABNORMAL LOW (ref >39.00)
Total CHOL/HDL Ratio: 6
Triglycerides: 335 mg/dL — ABNORMAL HIGH (ref 0.0–149.0)
VLDL: 67 mg/dL — ABNORMAL HIGH (ref 0.0–40.0)

## 2012-06-25 LAB — COMPREHENSIVE METABOLIC PANEL
ALT: 15 U/L (ref 0–35)
AST: 23 U/L (ref 0–37)
Alkaline Phosphatase: 69 U/L (ref 39–117)
BUN: 14 mg/dL (ref 6–23)
Calcium: 9.5 mg/dL (ref 8.4–10.5)
Chloride: 106 mEq/L (ref 96–112)
Creatinine, Ser: 0.8 mg/dL (ref 0.4–1.2)
Total Bilirubin: 0.4 mg/dL (ref 0.3–1.2)

## 2012-07-01 ENCOUNTER — Ambulatory Visit (INDEPENDENT_AMBULATORY_CARE_PROVIDER_SITE_OTHER): Payer: Medicare Other | Admitting: Family Medicine

## 2012-07-01 ENCOUNTER — Encounter: Payer: Self-pay | Admitting: Family Medicine

## 2012-07-01 VITALS — BP 136/80 | HR 90 | Temp 98.0°F | Ht 63.0 in | Wt 156.8 lb

## 2012-07-01 DIAGNOSIS — H3530 Unspecified macular degeneration: Secondary | ICD-10-CM

## 2012-07-01 DIAGNOSIS — B009 Herpesviral infection, unspecified: Secondary | ICD-10-CM

## 2012-07-01 DIAGNOSIS — C181 Malignant neoplasm of appendix: Secondary | ICD-10-CM

## 2012-07-01 DIAGNOSIS — Z8701 Personal history of pneumonia (recurrent): Secondary | ICD-10-CM

## 2012-07-01 DIAGNOSIS — I10 Essential (primary) hypertension: Secondary | ICD-10-CM

## 2012-07-01 DIAGNOSIS — M1990 Unspecified osteoarthritis, unspecified site: Secondary | ICD-10-CM

## 2012-07-01 DIAGNOSIS — B0050 Herpesviral ocular disease, unspecified: Secondary | ICD-10-CM

## 2012-07-01 DIAGNOSIS — M129 Arthropathy, unspecified: Secondary | ICD-10-CM

## 2012-07-01 DIAGNOSIS — Z933 Colostomy status: Secondary | ICD-10-CM

## 2012-07-01 NOTE — Assessment & Plan Note (Signed)
Stable on lisinopril. Lab Results  Component Value Date   CREATININE 0.8 06/25/2012

## 2012-07-01 NOTE — Assessment & Plan Note (Addendum)
Chronic.  As of last check, cancer slightly more active.  On 5FU and avastin with regular f/u with [facility].

## 2012-07-01 NOTE — Progress Notes (Signed)
  Subjective:    Patient ID: Whitney Meza, female    DOB: 26-Aug-1940, 72 y.o.   MRN: [id number]  HPI CC: follow up visit  Has 5FU infusion going in today.  [name] presents today as medicare wellness visit but did not bring wellness form so requests visit be changed to acute visit today.    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  Yesterday started 5FU infusion.  Gets every 2 weeks.  Also on avastin.  Has follow up MRI in 3 weeks.  Having trouble with appetite.  If eating after 7pm, has trouble with nausea/vomiting.  Back on chemo - 5FU and avastin. Has had knee pain recently.  Sees Dr. Juliene Pina for this.  Concerned about cartilage tear.  To get MRI likely next week.. Brings copy of recent blood work - CMP WNL (nonfasting), Hgb 10.2, plt 210, WBC 3.7. Noticing trouble with memory and finishing thoughts - since being on chemo. Will be due for tetanus later shot this year - prefers to receive once off chemo.  Wt Readings from Last 3 Encounters:  07/01/12 156 lb 12 oz (71.101 kg)  05/08/12 163 lb 4 oz (74.05 kg)  05/01/12 165 lb (74.844 kg)   Past Medical History  Diagnosis Date  . Depression 12/17/89  . Diverticulosis of colon 1/04  . GERD (gastroesophageal reflux disease) 1/04  . HLD (hyperlipidemia) 06/1993  . Hyperthyroidism   . 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 Followed by [facility]  . Colostomy in place   . Colovesical fistula   . HTN (hypertension)   . Arthritis   . 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 Macular degeneration     Review of Systems Per HPI    Objective:   Physical Exam  Nursing note and vitals reviewed. Constitutional: She appears well-developed and well-nourished. No distress.       5FU infusion per portacath  HENT:  Mouth/Throat: Oropharynx is clear and moist. No oropharyngeal exudate.  Eyes: Conjunctivae normal and  EOM are normal. Pupils are equal, round, and reactive to light. No scleral icterus.  Neck: Normal range of motion. Neck supple.  Cardiovascular: Normal rate, regular rhythm, normal heart sounds and intact distal pulses.   No murmur heard. Pulmonary/Chest: Effort normal and breath sounds normal. No respiratory distress. She has no wheezes. She has no rales.  Musculoskeletal: She exhibits no edema.  Lymphadenopathy:    She has no cervical adenopathy.  Skin: Skin is warm and dry. No rash noted.  Psychiatric: She has a normal mood and affect.       talkative       Assessment & Plan:  Will return at her convenience for medicare wellness visit.

## 2012-07-01 NOTE — Patient Instructions (Addendum)
Good to see you today, call us with questions. No changes today. Return at your convenience for medicare wellness visit.

## 2012-07-01 NOTE — Assessment & Plan Note (Signed)
Chronic, recently worsening knee pain.  Has upcoming f/u and MRI scheduled with Dr. Juliene Pina.  Hopeful to avoid surgery.

## 2012-07-08 ENCOUNTER — Ambulatory Visit (INDEPENDENT_AMBULATORY_CARE_PROVIDER_SITE_OTHER): Payer: Medicare Other | Admitting: Family Medicine

## 2012-07-08 ENCOUNTER — Encounter: Payer: Self-pay | Admitting: Family Medicine

## 2012-07-08 VITALS — BP 134/60 | HR 100 | Temp 97.4°F | Ht 63.0 in | Wt 152.2 lb

## 2012-07-08 DIAGNOSIS — A084 Viral intestinal infection, unspecified: Secondary | ICD-10-CM | POA: Insufficient documentation

## 2012-07-08 DIAGNOSIS — A088 Other specified intestinal infections: Secondary | ICD-10-CM

## 2012-07-08 NOTE — Progress Notes (Signed)
  Subjective:    Patient ID: Whitney Meza, female    DOB: 1941/03/12, 72 y.o.   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 Restarted 5FU infusion. Gets every 2 weeks. Has follow up MRI in 3 weeks. Having trouble with appetite. If eating after 7pm, has trouble with nausea/vomiting.   On 5FU and avastin chemo.  Feeling ill over weekend.  Started Friday afternoon - nauseated with food.  + diarrhea over weekend.  No blood.  Then started vomiting - several times, clear.  NBNB.  After 1 day of this started feeling better.  Today is best day she's felt over last 5 days.  Stools still loose but improving.  Thinks staying hydrated - lots of small sips and chewing crushed ice to stay hydrated.  Making good urine.  Also with cough, dry.  Overall improving.  Taking pepto bismol and nausea medicine from chemo (thinks phenergan), didn't help.    Wt Readings from Last 3 Encounters:  07/08/12 152 lb 4 oz (69.06 kg)  07/01/12 156 lb 12 oz (71.101 kg)  05/08/12 163 lb 4 oz (74.05 kg)  Denies fevers/chills.  No abd pain.  No recent eating out or suspicious foods.  No sick contacts at home Stomach flu is going around.  Past Medical History  Diagnosis Date  . Depression 12/17/89  . Diverticulosis of colon 1/04  . GERD (gastroesophageal reflux disease) 1/04  . HLD (hyperlipidemia) 06/1993  . Hyperthyroidism   . 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 Followed by [facility]  . Colostomy in place   . Colovesical fistula   . HTN (hypertension)   . Arthritis   . 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 Macular degeneration      Review of Systems Per HPI    Objective:   Physical Exam  Nursing note and vitals reviewed. Constitutional: She appears well-developed and well-nourished. No distress.  HENT:  Mouth/Throat: Oropharynx is clear and  moist. No oropharyngeal exudate.       MMM  Cardiovascular: Normal rate, regular rhythm, normal heart sounds and intact distal pulses.   No murmur heard. Pulmonary/Chest: Effort normal and breath sounds normal. No respiratory distress. She has no wheezes. She has no rales.       Slight basilar crackles  Abdominal: Soft. Normal appearance and bowel sounds are normal. She exhibits no distension and no mass. There is no hepatosplenomegaly. There is no tenderness. There is no rebound and no guarding.       Colostomy in place  Musculoskeletal: She exhibits no edema.  Skin: Skin is warm and dry. No rash noted.  Psychiatric: She has a normal mood and affect.       Assessment & Plan:

## 2012-07-08 NOTE — Assessment & Plan Note (Addendum)
Anticipate viral gastroenteritis. Discussed supportive care. Recheck in 2 days when returns for CPE. nontoxic today, well hydrated today.

## 2012-07-08 NOTE — Patient Instructions (Addendum)
I do think you had viral gastroenteritis or stomach flu. Most important thing is to stay well hydrated with small sips of fluid throughout the day. Phenergan for nausea as needed (at home). We will recheck on Thursday. I'm glad you're starting to feel better.  Viral Gastroenteritis Viral gastroenteritis is also known as stomach flu. This condition affects the stomach and intestinal tract. It can cause sudden diarrhea and vomiting. The illness typically lasts 3 to 8 days. Most people develop an immune response that eventually gets rid of the virus. While this natural response develops, the virus can make you quite ill. CAUSES  Many different viruses can cause gastroenteritis, such as rotavirus or noroviruses. You can catch one of these viruses by consuming contaminated food or water. You may also catch a virus by sharing utensils or other personal items with an infected person or by touching a contaminated surface. SYMPTOMS  The most common symptoms are diarrhea and vomiting. These problems can cause a severe loss of body fluids (dehydration) and a body salt (electrolyte) imbalance. Other symptoms may include:  Fever.  Headache.  Fatigue.  Abdominal pain. DIAGNOSIS  Your caregiver can usually diagnose viral gastroenteritis based on your symptoms and a physical exam. A stool sample may also be taken to test for the presence of viruses or other infections. TREATMENT  This illness typically goes away on its own. Treatments are aimed at rehydration. The most serious cases of viral gastroenteritis involve vomiting so severely that you are not able to keep fluids down. In these cases, fluids must be given through an intravenous line (IV). HOME CARE INSTRUCTIONS   Drink enough fluids to keep your urine clear or pale yellow. Drink small amounts of fluids frequently and increase the amounts as tolerated.  Ask your caregiver for specific rehydration instructions.  Avoid:  Foods high in  sugar.  Alcohol.  Carbonated drinks.  Tobacco.  Juice.  Caffeine drinks.  Extremely hot or cold fluids.  Fatty, greasy foods.  Too much intake of anything at one time.  Dairy products until 24 to 48 hours after diarrhea stops.  You may consume probiotics. Probiotics are active cultures of beneficial bacteria. They may lessen the amount and number of diarrheal stools in adults. Probiotics can be found in yogurt with active cultures and in supplements.  Wash your hands well to avoid spreading the virus.  Only take over-the-counter or prescription medicines for pain, discomfort, or fever as directed by your caregiver. Do not give aspirin to children. Antidiarrheal medicines are not recommended.  Ask your caregiver if you should continue to take your regular prescribed and over-the-counter medicines.  Keep all follow-up appointments as directed by your caregiver. SEEK IMMEDIATE MEDICAL CARE IF:   You are unable to keep fluids down.  You do not urinate at least once every 6 to 8 hours.  You develop shortness of breath.  You notice blood in your stool or vomit. This may look like coffee grounds.  You have abdominal pain that increases or is concentrated in one small area (localized).  You have persistent vomiting or diarrhea.  You have a fever.  The patient is a child younger than 3 months, and he or she has a fever.  The patient is a child older than 3 months, and he or she has a fever and persistent symptoms.  The patient is a child older than 3 months, and he or she has a fever and symptoms suddenly get worse.  The patient is a baby,  and he or she has no tears when crying. MAKE SURE YOU:   Understand these instructions.  Will watch your condition.  Will get help right away if you are not doing well or get worse. Document Released: 06/04/2005 Document Revised: 08/27/2011 Document Reviewed: 03/21/2011 [facility] Patient Information 2013 [place].

## 2012-07-09 ENCOUNTER — Encounter: Payer: Medicare Other | Admitting: Family Medicine

## 2012-07-10 ENCOUNTER — Encounter: Payer: Self-pay | Admitting: Family Medicine

## 2012-07-10 ENCOUNTER — Ambulatory Visit (INDEPENDENT_AMBULATORY_CARE_PROVIDER_SITE_OTHER): Payer: Medicare Other | Admitting: Family Medicine

## 2012-07-10 VITALS — BP 136/80 | HR 80 | Temp 98.3°F | Wt 151.0 lb

## 2012-07-10 DIAGNOSIS — C180 Malignant neoplasm of cecum: Secondary | ICD-10-CM

## 2012-07-10 DIAGNOSIS — A084 Viral intestinal infection, unspecified: Secondary | ICD-10-CM

## 2012-07-10 DIAGNOSIS — Z0000 Encounter for general adult medical examination without abnormal findings: Secondary | ICD-10-CM

## 2012-07-10 NOTE — Progress Notes (Signed)
Subjective:    Patient ID: Whitney Meza, female    DOB: [birth year], 72 y.o.   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 On 5FU infusion as well as avastin. Gets every 2 weeks. Has regular follow up MRIs.  Having trouble with appetite. If eating after 7pm, has trouble with nausea/vomiting.  Had L knee MRI yesterday, may be planning upcoming surgery.  Seen here 2d ago with viral gastroenteritis - those sxs are resolving.  This morning had episode of diarrhea, otherwise BMs regularizing through colostomy.  Falls - at risk.  Has had 2 in last year.  No injury. Declines depression/anhedonia.  Attributes recent mood issues to feeling ill with pneumonia then gastroenteritis. Hearing screen - failed.  Known L hearing loss.  Present for last 5-6 yrs.  Declines audiology referral currently. 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  Preventative: Mammogram - 08/2011, due in 2 months. Pap smear - last had 2010 and normal, then had hysterectomy for metastatic ileocecal cancer. Colon cancer screening - done in 10/19/2011 for GI bleed, also had EGD. Flu shot 06/2012 Pneumovax completed Td 2004.  Due 01/2013. Shingles - deferred 2/2 chemo and cancer. Advanced directives: Would want daughter to be HCPOA.  Would like packet of information for advanced directives.  Considering donating body.  Medications and allergies reviewed and updated in chart.  Past histories reviewed and updated if relevant as below. Patient Active Problem List  Diagnosis  . Carcinoma of ileocecal valve  . HYPERLIPIDEMIA  . DEPRESSION  . HYPERTENSION  . GERD  . DIVERTICULOSIS, COLON  . History of pneumonia  . Macular degeneration  . Ocular herpes simplex  . Colostomy in place  . Arthritis  . Viral gastroenteritis   Past Medical History  Diagnosis Date  . Depression 12/17/89  . Diverticulosis of colon 1/04  . GERD  (gastroesophageal reflux disease) 1/04  . HLD (hyperlipidemia) 06/1993  . Hyperthyroidism   . 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 Followed by [facility]  . Colostomy in place   . Colovesical fistula   . HTN (hypertension)   . Arthritis   . 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 Macular degeneration    Past Surgical History  Procedure Date  . Tonsillectomy 1956  . Septoplasty 1980's  . Cesarean section     x 2; first FTP; 2nd due to first  . Dilation and curettage of uterus     x 3  . Bartholins gland removal     due to frequent infections  . Colonoscopy 1998  . Cardiac catheterization 6/91    negative  . Esophagogastroduodenoscopy 06/24/02    bx. gastritis  . Colonoscopy 06/24/02  . Back surgery 2005    L4/5 Dr. Darrelyn Hillock  . Knee arthroscopy 04/03/05    Right knee- Dr. Darrelyn Hillock  . Exploratory laparotomy 8/10    appendiceal adenoca  . Total abdominal hysterectomy w/ bilateral salpingoophorectomy 02/08/09    Dr. Nelly Rout [facility]  . Omentectomy 02/08/09    Dr. Nelly Rout [facility]  . Distal ileum/cecum resection anastamosis of terminal ileum to transverse colon 02/08/09    Dr. Nelly Rout [facility]  . Appendectomy 02/08/09    Dr. Nelly Rout [facility]  . Colectomy 10/21/09    with end colostomy-ventral hernia repair extensive lysis of adhesions (Dr. Ardelle Balls)  .  Colovesical fistula repair 2011  . Colonoscopy 10/2011    for GI bleed - healthy stoma, diverticulosis  . Esophagogastroduodenoscopy 10/2011    WNL   History  Substance Use Topics  . Smoking status: Former Smoker    Quit date: 06/18/1984  . Smokeless tobacco: Not on file  . Alcohol Use: No   Family History  Problem Relation Age of Onset  . Alzheimer's disease Father   . Arthritis Sister   . GER disease Sister   . Irritable bowel syndrome Sister   . Diabetes Maternal Grandmother   . Cancer      uncles   Allergies  Allergen  Reactions  . Sulfonamide Derivatives     Throat swelling  . Ibuprofen Other (See Comments)    Caused bleeding  . Zyban (Bupropion Hcl)     intol   Current Outpatient Prescriptions on File Prior to Visit  Medication Sig Dispense Refill  . acyclovir (ZOVIRAX) 800 MG tablet Take 800 mg by mouth as directed.        . Bevacizumab (AVASTIN IV) Inject into the vein.      . calcium carbonate (TUMS - DOSED IN MG ELEMENTAL CALCIUM) 500 MG chewable tablet Chew 1 tablet by mouth as needed.       . Flaxseed, Linseed, (FLAX SEED OIL) 1000 MG CAPS Take 1 capsule by mouth daily.        Marland Kitchen lisinopril (PRINIVIL,ZESTRIL) 20 MG tablet Take 1 tablet (20 mg total) by mouth daily.  30 tablet  11  . Multiple Vitamin (MULTIVITAMIN) tablet Take 1 tablet by mouth daily.        . pantoprazole (PROTONIX) 40 MG tablet Take 40 mg by mouth 2 (two) times daily.      . sodium chloride 0.9 % SOLN 150 mL with fluorouracil 5 GM/100ML SOLN 200 mg/m2/day Inject 200 mg/m2/day into the vein over 96 hr.      . vitamin B-12 (CYANOCOBALAMIN) 1000 MCG tablet Take 1,000 mcg by mouth daily.           Review of Systems  Constitutional: Positive for fever (recently ill) and unexpected weight change (decreased). Negative for chills, activity change and fatigue.  HENT: Negative for hearing loss and neck pain.   Eyes: Negative for visual disturbance.  Respiratory: Positive for cough (resolving cough). Negative for chest tightness, shortness of breath and wheezing.   Cardiovascular: Negative for chest pain, palpitations and leg swelling.  Gastrointestinal: Positive for vomiting (recent viral gastro), abdominal pain and diarrhea (recent viral gastro). Negative for nausea, constipation, blood in stool and abdominal distention.  Genitourinary: Negative for hematuria and difficulty urinating.  Musculoskeletal: Negative for myalgias and arthralgias.  Skin: Negative for rash.  Neurological: Positive for headaches (possibly attributed to  dehydration from recent vral gastro). Negative for dizziness, seizures and syncope.  Hematological: Does not bruise/bleed easily.  Psychiatric/Behavioral: Negative for dysphoric mood. The patient is not nervous/anxious.        Objective:   Physical Exam  Nursing note and vitals reviewed. Constitutional: She is oriented to person, place, and time. She appears well-developed and well-nourished. No distress.  HENT:  Head: Normocephalic and atraumatic.  Right Ear: Hearing, tympanic membrane, external ear and ear canal normal.  Left Ear: Hearing, tympanic membrane, external ear and ear canal normal.  Nose: Nose normal.  Mouth/Throat: Oropharynx is clear and moist. No oropharyngeal exudate.  Eyes: Conjunctivae normal and EOM are normal. Pupils are equal, round, and reactive to light. No scleral icterus.  Neck:  Normal range of motion. Neck supple. Carotid bruit is not present.  Cardiovascular: Normal rate, regular rhythm, normal heart sounds and intact distal pulses.   No murmur heard. Pulses:      Radial pulses are 2+ on the right side, and 2+ on the left side.  Pulmonary/Chest: Effort normal and breath sounds normal. No respiratory distress. She has no wheezes. She has no rales. Right breast exhibits no inverted nipple, no mass, no nipple discharge, no skin change and no tenderness. Left breast exhibits no inverted nipple, no mass, no nipple discharge, no skin change and no tenderness.  Abdominal: Soft. Bowel sounds are normal. She exhibits no distension and no mass. There is no tenderness. There is no rebound and no guarding.       Colostomy in place, dressing c/d/i  Musculoskeletal: Normal range of motion. She exhibits no edema.  Lymphadenopathy:    She has no cervical adenopathy.    She has no axillary adenopathy.       Right axillary: No lateral adenopathy present.       Left axillary: No lateral adenopathy present.      Right: No supraclavicular adenopathy present.       Left: No  supraclavicular adenopathy present.  Neurological: She is alert and oriented to person, place, and time.       CN grossly intact, station and gait intact  Skin: Skin is warm and dry. No rash noted.  Psychiatric: She has a normal mood and affect. Her behavior is normal. Judgment and thought content normal.       Assessment & Plan:

## 2012-07-10 NOTE — Assessment & Plan Note (Signed)
Resolving well from this  

## 2012-07-10 NOTE — Assessment & Plan Note (Addendum)
I have personally reviewed the Medicare Annual Wellness questionnaire and have noted 1. The patient's medical and social history 2. Their use of alcohol, tobacco or illicit drugs 3. Their current medications and supplements 4. The patient's functional ability including ADL's, fall risks, home safety risks and hearing or visual impairment. 5. Diet and physical activity 6. Evidence for depression or mood disorders The patients weight, height, BMI have been recorded in the chart.  Hearing and vision has been addressed. I have made referrals, counseling and provided education to the patient based review of the above and I have provided the pt with a written personalized care plan for preventive services. See scanned questionairre. Advanced directives discussed: would want daughter to be HCPOA.  Interested in donating body - advised to check with [facility] on this.  Reviewed preventative protocols and updated unless pt declined. UTD immunizations. Will schedule her own mammo when due

## 2012-07-10 NOTE — Patient Instructions (Addendum)
Call [facility] in March to schedule mammogram.  If you need assistance with this, let us know. You'll be due for tetanus shot in August of this year. Good to see you today, call us with questions.

## 2012-07-10 NOTE — Assessment & Plan Note (Signed)
Follows with [facility].

## 2012-11-03 ENCOUNTER — Encounter: Payer: Self-pay | Admitting: Family Medicine

## 2012-11-03 ENCOUNTER — Ambulatory Visit (INDEPENDENT_AMBULATORY_CARE_PROVIDER_SITE_OTHER): Payer: Medicare Other | Admitting: Family Medicine

## 2012-11-03 VITALS — BP 118/66 | HR 88 | Temp 98.3°F | Wt 143.5 lb

## 2012-11-03 DIAGNOSIS — S20161A Insect bite (nonvenomous) of breast, right breast, initial encounter: Secondary | ICD-10-CM

## 2012-11-03 DIAGNOSIS — J329 Chronic sinusitis, unspecified: Secondary | ICD-10-CM | POA: Insufficient documentation

## 2012-11-03 DIAGNOSIS — S20169A Insect bite (nonvenomous) of breast, unspecified breast, initial encounter: Secondary | ICD-10-CM | POA: Insufficient documentation

## 2012-11-03 DIAGNOSIS — S30860A Insect bite (nonvenomous) of lower back and pelvis, initial encounter: Secondary | ICD-10-CM

## 2012-11-03 MED ORDER — DOXYCYCLINE HYCLATE 100 MG PO CAPS: 100.0000 mg | ORAL_CAPSULE | Freq: Two times a day (BID) | ORAL | Status: DC

## 2012-11-03 NOTE — Patient Instructions (Signed)
Treat both tick bite and possible sinus infection with 10 d course of doxycycline. Let us know if fever, headache, joint aches , or rash develops. Good to see you today, call us with questions

## 2012-11-03 NOTE — Assessment & Plan Note (Signed)
Removed all tick parts, verified under microscope. Given unknown duration of tick bite, will place on 10 d doxy course.

## 2012-11-03 NOTE — Assessment & Plan Note (Signed)
Sinus congestion going on 3+ weeks, associated with purulent nasal discharge - will cover for acute bacterial sinusitis with 10d course doxy.  Pt agrees with plan, will start OTC antihistamine

## 2012-11-03 NOTE — Progress Notes (Signed)
  Subjective:    Patient ID: Whitney Meza, female    DOB: 1940/07/29, 71 y.o.   MRN: [id number]  HPI CC: tick bite  Noticed tick bite under right breast yesterday morning.  Unsure how long it was present.  Tried to remove with match then tweezers, also had friend at church ([employer]) try to remove.  Both unsuccessful.  Cleaned with peroxide.  Has been very congested for last 4 weeks. Blowing purulent mucous out of nose.  Last week had low grade temperature at chemo.  Has been taking OTC allergy pills. No ear or tooth pain, no headaches.   About to travel to [place] for college graduation.  Past Medical History  Diagnosis Date  . Depression 12/17/89  . Diverticulosis of colon 1/04  . GERD (gastroesophageal reflux disease) 1/04  . HLD (hyperlipidemia) 06/1993  . Hyperthyroidism   . 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 Followed by [facility]  . Colostomy in place   . Colovesical fistula   . HTN (hypertension)   . Arthritis   . 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 Macular degeneration      Review of Systems Per HPI    Objective:   Physical Exam  Nursing note and vitals reviewed. Constitutional: She appears well-developed and well-nourished. No distress.  HENT:  Head: Normocephalic and atraumatic.  Right Ear: Hearing, tympanic membrane, external ear and ear canal normal.  Left Ear: Hearing, tympanic membrane, external ear and ear canal normal.  Nose: No mucosal edema or rhinorrhea. Right sinus exhibits no maxillary sinus tenderness and no frontal sinus tenderness. Left sinus exhibits no maxillary sinus tenderness and no frontal sinus tenderness.  Mouth/Throat: Uvula is midline, oropharynx is clear and moist and mucous membranes are normal. No oropharyngeal exudate, posterior oropharyngeal edema, posterior oropharyngeal erythema or tonsillar abscesses.  Congested TMs  Eyes: Conjunctivae  and EOM are normal. Pupils are equal, round, and reactive to light. No scleral icterus.  Neck: Normal range of motion. Neck supple.  Lymphadenopathy:    She has no cervical adenopathy.  Skin: Skin is warm and dry. No rash noted.     Tick jaw embedded in right inner breast with surrounding granulation tissue - area cleaned with alcohol and all tick parts fully removed with forceps       Assessment & Plan:

## 2013-01-20 ENCOUNTER — Ambulatory Visit (INDEPENDENT_AMBULATORY_CARE_PROVIDER_SITE_OTHER): Payer: Medicare Other | Admitting: Family Medicine

## 2013-01-20 ENCOUNTER — Telehealth: Payer: Self-pay | Admitting: Family Medicine

## 2013-01-20 ENCOUNTER — Ambulatory Visit (INDEPENDENT_AMBULATORY_CARE_PROVIDER_SITE_OTHER)
Admission: RE | Admit: 2013-01-20 | Discharge: 2013-01-20 | Disposition: A | Discharge status: Home / Self Care | Payer: Medicare Other | Source: Ambulatory Visit | Attending: Family Medicine | Admitting: Family Medicine

## 2013-01-20 ENCOUNTER — Encounter: Payer: Self-pay | Admitting: Family Medicine

## 2013-01-20 VITALS — BP 118/60 | HR 76 | Temp 98.4°F | Wt 140.0 lb

## 2013-01-20 DIAGNOSIS — S6992XA Unspecified injury of left wrist, hand and finger(s), initial encounter: Secondary | ICD-10-CM | POA: Insufficient documentation

## 2013-01-20 DIAGNOSIS — R1310 Dysphagia, unspecified: Secondary | ICD-10-CM

## 2013-01-20 DIAGNOSIS — S6990XA Unspecified injury of unspecified wrist, hand and finger(s), initial encounter: Secondary | ICD-10-CM

## 2013-01-20 DIAGNOSIS — S59909A Unspecified injury of unspecified elbow, initial encounter: Secondary | ICD-10-CM

## 2013-01-20 NOTE — Assessment & Plan Note (Addendum)
Concern for scaphoid fracture - xrays obtained today - negative for this. Anticipate wrist ligament strain. Placed in wrist brace. In interim, use tylenol prn pain. Consider dexa in future - never done previously.

## 2013-01-20 NOTE — Progress Notes (Signed)
Subjective:    Patient ID: Whitney Meza, female    DOB: 10/27/1940, 72 y.o.   MRN: [id number]  HPI CC: L hand pain  Yesterday playing with grandson's dog - fell and hit L hip, left elbow/hand/wrist.  All else improving, but persistent L wrist pain and now noticed dorsal lateral wrist knot.  Fell on concrete.    Hasn't taken anything for pain.  No ice/heating pad.  No bone density eval in past. No prior fracture in past.  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 On 5FU infusion as well as avastin. Gets every 2 weeks. Has regular follow up MRIs.  Recent mammogram WNL Birads 1, rec rpt 1 yr.  Wt Readings from Last 3 Encounters:  01/20/13 140 lb (63.504 kg)  11/03/12 143 lb 8 oz (65.091 kg)  07/10/12 151 lb (68.493 kg)  weight loss noted - "I can't eat". Nauseated, vomiting with each meal. Dysphagia endorsed. Appetite good, stays hungry.  Taking protonix 40mg  bid and nexium 20mg  bid, also takes reglan QID.  Had endoscopy 10/2011.  Had UGI with small bowel follow through overall normal 08/2012.  Dr. Rod Mae is GI oncologist at [facility].  Prior eval thought intermittent nausea/vomiting due to possible recurrent partial small bowel obstructions.  Has bought case of boost to try and increase protein consumption, but finds supplements too sweet.   Past Medical History  Diagnosis Date  . Depression 12/17/89  . Diverticulosis of colon 1/04  . GERD (gastroesophageal reflux disease) 1/04  . HLD (hyperlipidemia) 06/1993  . Hyperthyroidism   . 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 Followed by [facility]  . Colostomy in place   . Colovesical fistula   . HTN (hypertension)   . Arthritis   . 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 Macular degeneration     Past Surgical History  Procedure Laterality Date  . Tonsillectomy  1956  . Septoplasty  1980's  . Cesarean section      x  2; first FTP; 2nd due to first  . Dilation and curettage of uterus      x 3  . Bartholins gland removal      due to frequent infections  . Colonoscopy  1998  . Cardiac catheterization  6/91    negative  . Esophagogastroduodenoscopy  06/24/02    bx. gastritis  . Colonoscopy  06/24/02  . Back surgery  2005    L4/5 Dr. Darrelyn Hillock  . Knee arthroscopy  04/03/05    Right knee- Dr. Darrelyn Hillock  . Exploratory laparotomy  8/10    appendiceal adenoca  . Total abdominal hysterectomy w/ bilateral salpingoophorectomy  02/08/09    Dr. Nelly Rout [facility]  . Omentectomy  02/08/09    Dr. Nelly Rout [facility]  . Distal ileum/cecum resection anastamosis of terminal ileum to transverse colon  02/08/09    Dr. Nelly Rout [facility]  . Appendectomy  02/08/09    Dr. Nelly Rout [facility]  . Colectomy  10/21/09    with end colostomy-ventral hernia repair extensive lysis of adhesions (Dr. Ardelle Balls)  . Colovesical fistula repair  2011  . Colonoscopy  10/2011    for GI bleed - healthy stoma, diverticulosis  . Esophagogastroduodenoscopy  10/2011    WNL   Review of Systems Per HPI    Objective:   Physical Exam  Nursing note and vitals reviewed. Constitutional: She appears well-developed and  well-nourished. No distress.  Musculoskeletal: She exhibits no edema.  R wrist WNL L wrist tender at anatomical snuff box and swelling at dorsal lateral carpals.  Pain with forced pronation/supination of L wrist. 2+ rad pulses, sensation intact.  No pain with int/ext rotation of hip bilaterally Tender to palpation posteriolateral hamstring on left - swelling present but no ecchymosis. No pain at GTB or sciatic notch.       Assessment & Plan:

## 2013-01-20 NOTE — Patient Instructions (Addendum)
Let Dr. Rod Mae know about trouble swallowing.  I'm concerned about weight loss.  May try glucerna supplements (may be less sweet). Use tylenol 500mg  three times daily as needed for pain. Xray today to check on wrist bones - looking ok .  I will call you if any change in plan based on radiologist evaluation. Good to see you today, call us with questions.

## 2013-01-20 NOTE — Telephone Encounter (Addendum)
Dr. Monica Martinez is her cancer doctor. I'd like her to return to see her to discuss difficulty with oral intake.  Briefly discussed at office visit today.

## 2013-01-20 NOTE — Telephone Encounter (Signed)
Spoke with patient about this during her discharge and left message on cell reminding her to discuss at upcoming appt.

## 2013-01-20 NOTE — Assessment & Plan Note (Signed)
I did ask her to return to see Dr. Monica Martinez to discuss progressively worsening dysphagia with noted weight loss. Last UGI was 08/2012.  Progressively worsening since then to the point of vomiting with each feed. She will try glucerna or boost supplementation daily.

## 2013-03-16 ENCOUNTER — Encounter: Payer: Self-pay | Admitting: Family Medicine

## 2013-03-16 ENCOUNTER — Ambulatory Visit (INDEPENDENT_AMBULATORY_CARE_PROVIDER_SITE_OTHER): Payer: Medicare Other | Admitting: Family Medicine

## 2013-03-16 VITALS — BP 144/70 | HR 78 | Temp 98.2°F | Wt 140.0 lb

## 2013-03-16 DIAGNOSIS — J069 Acute upper respiratory infection, unspecified: Secondary | ICD-10-CM

## 2013-03-16 MED ORDER — AZITHROMYCIN 250 MG PO TABS: ORAL_TABLET | ORAL | Status: DC

## 2013-03-16 NOTE — Patient Instructions (Signed)
I think this is viral infection. However given your history of pneumonia and current chemo, script to hold on to - if no noted improvement by Wednesday, start antibiotic Ok to take immediate release guaifenesin or plain mucinex with plenty of water to mobilize course. Push fluids and rest. Tylenol as needed Watch for fever> 101 or worsening productive cough - if this happens let me or cancer doctors know I hope you start feeling better!

## 2013-03-16 NOTE — Assessment & Plan Note (Signed)
Anticipate viral infection, however given immunocompromised status and h/o PNA after URI last year, provided with WASP for zpack with instructions to fill. supportive care as per instructions. Update if not improved or worsening. Pt agrees with plan.

## 2013-03-16 NOTE — Progress Notes (Signed)
  Subjective:    Patient ID: Whitney Meza, female    DOB: Jun 05, 1941, 72 y.o.   MRN: [id number]  HPI CC: ?URI   3d h/o cold sxs.  Rhinorrhea, sore throat, and dry cough. No fevers/chills, abd pain, headache, ear or tooth pain, PNDrainage. Hasn't tried anything OTC so far. 2 sick contact at home and church recently. No smokers at home.  No flu shot yet.  Bad news last CT scan - new tumors in abdomen.  Planning on starting new chemotherapy next week along with current 5FU. H/o URI that led to bronchitis and pneumonia. Also started on new steroid for ?reflux  Past Medical History  Diagnosis Date  . Depression 12/17/89  . Diverticulosis of colon 1/04  . GERD (gastroesophageal reflux disease) 1/04  . HLD (hyperlipidemia) 06/1993  . Hyperthyroidism   . 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 Followed by [facility]  . Colostomy in place   . Colovesical fistula   . HTN (hypertension)   . Arthritis   . 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 Macular degeneration      Review of Systems Per HPI    Objective:   Physical Exam  Nursing note and vitals reviewed. Constitutional: She appears well-developed and well-nourished. No distress.  HENT:  Head: Normocephalic and atraumatic.  Right Ear: Hearing, tympanic membrane, external ear and ear canal normal.  Left Ear: Hearing, tympanic membrane, external ear and ear canal normal.  Nose: No mucosal edema or rhinorrhea. Right sinus exhibits no maxillary sinus tenderness and no frontal sinus tenderness. Left sinus exhibits no maxillary sinus tenderness and no frontal sinus tenderness.  Mouth/Throat: Uvula is midline, oropharynx is clear and moist and mucous membranes are normal. No oropharyngeal exudate, posterior oropharyngeal edema, posterior oropharyngeal erythema or tonsillar abscesses.  Eyes: Conjunctivae and EOM are normal. Pupils are equal, round,  and reactive to light. No scleral icterus.  Neck: Normal range of motion. Neck supple.  Cardiovascular: Normal rate, regular rhythm, normal heart sounds and intact distal pulses.   No murmur heard. Pulmonary/Chest: Effort normal and breath sounds normal. No respiratory distress. She has no wheezes. She has no rales.  Lymphadenopathy:    She has no cervical adenopathy.  Skin: Skin is warm and dry. No rash noted.       Assessment & Plan:

## 2013-04-02 ENCOUNTER — Ambulatory Visit (INDEPENDENT_AMBULATORY_CARE_PROVIDER_SITE_OTHER): Payer: Medicare Other

## 2013-04-02 DIAGNOSIS — Z23 Encounter for immunization: Secondary | ICD-10-CM

## 2013-05-05 ENCOUNTER — Ambulatory Visit (INDEPENDENT_AMBULATORY_CARE_PROVIDER_SITE_OTHER): Payer: Medicare Other | Admitting: Family Medicine

## 2013-05-05 ENCOUNTER — Encounter: Payer: Self-pay | Admitting: Family Medicine

## 2013-05-05 VITALS — BP 146/82 | HR 108 | Temp 101.4°F | Wt 136.5 lb

## 2013-05-05 DIAGNOSIS — C180 Malignant neoplasm of cecum: Secondary | ICD-10-CM

## 2013-05-05 DIAGNOSIS — R509 Fever, unspecified: Secondary | ICD-10-CM | POA: Insufficient documentation

## 2013-05-05 DIAGNOSIS — D849 Immunodeficiency, unspecified: Secondary | ICD-10-CM

## 2013-05-05 LAB — COMPREHENSIVE METABOLIC PANEL
Albumin: 3.3 g/dL — ABNORMAL LOW (ref 3.5–5.2)
Alkaline Phosphatase: 63 U/L (ref 39–117)
BUN: 13 mg/dL (ref 6–23)
CO2: 30 mEq/L (ref 19–32)
GFR: 72.08 mL/min (ref >60.00)
Glucose, Bld: 92 mg/dL (ref 70–99)
Sodium: 135 mEq/L (ref 135–145)
Total Bilirubin: 0.6 mg/dL (ref 0.3–1.2)
Total Protein: 6.9 g/dL (ref 6.0–8.3)

## 2013-05-05 LAB — CBC WITH DIFFERENTIAL/PLATELET
Basophils Relative: 0.5 % (ref 0.0–3.0)
Eosinophils Relative: 0.8 % (ref 0.0–5.0)
HCT: 38 % (ref 36.0–46.0)
Monocytes Relative: 9.9 % (ref 3.0–12.0)
Neutrophils Relative %: 73 % (ref 43.0–77.0)
Platelets: 165 10*3/uL (ref 150.0–400.0)
RBC: 3.99 Mil/uL (ref 3.87–5.11)
WBC: 6.6 10*3/uL (ref 4.5–10.5)

## 2013-05-05 MED ORDER — AZITHROMYCIN 250 MG PO TABS: ORAL_TABLET | ORAL | Status: DC

## 2013-05-05 NOTE — Progress Notes (Signed)
Pre-visit discussion using our clinic review tool. No additional management support is needed unless otherwise documented below in the visit note.  

## 2013-05-05 NOTE — Assessment & Plan Note (Signed)
Fever in immunocompromised status (recent chemotherapy 8 days ago). sxs most consistent with viral URI however will check CBC to eval for neutropenia as well as blood culture x1. If febrile neutropenia, will call Dr. Rod Mae to discuss case. In interim, will start zpack to cover for bacterial infection after URI with chemo on board. Pt agrees with plan.

## 2013-05-05 NOTE — Patient Instructions (Signed)
I am worried about this fever in your immunocompromised status - we will check blood work today and I'd like you to start a zpack. May use tylenol for fever as needed. We will call you with results of blood work today.

## 2013-05-05 NOTE — Progress Notes (Signed)
  Subjective:    Patient ID: Whitney Meza, female    DOB: 05-21-1941, 72 y.o.   MRN: [id number]  HPI CC:URI, fever  Pleasant but unfortunate 72 yo with h/o metastatic ileocecal adenocarcinoma with recent recurrence this fall now on 5FU as well as chemotherapy presents today with 1d h/o leg aches, fever to 101.7, generalized malaise.  Mild sore throat with PNDrainage and congestion.  "Feels like a cold".  Minimal cough.  No headache, ear or tooth pain, diarrhea, urinary changes (dysuria, urgency or frequency).  Last chemotherapy was done 8 days ago.  Has not been around sick contacts. [place].  Did receive flu shot 04/02/2013.  Past Medical History  Diagnosis Date  . Depression 12/17/89  . Diverticulosis of colon 1/04  . GERD (gastroesophageal reflux disease) 1/04  . HLD (hyperlipidemia) 06/1993  . Hyperthyroidism   . 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 Followed by [facility]  . Colostomy in place   . Colovesical fistula   . HTN (hypertension)   . Arthritis   . 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 Macular degeneration      Review of Systems Per HPI    Objective:   Physical Exam  Nursing note and vitals reviewed. Constitutional: She appears well-developed and well-nourished. No distress.  HENT:  Head: Normocephalic and atraumatic.  Right Ear: Hearing, tympanic membrane, external ear and ear canal normal.  Left Ear: Hearing, tympanic membrane, external ear and ear canal normal.  Nose: Mucosal edema present. No rhinorrhea.  Mouth/Throat: Uvula is midline, oropharynx is clear and moist and mucous membranes are normal. No oropharyngeal exudate, posterior oropharyngeal edema, posterior oropharyngeal erythema or tonsillar abscesses.  Eyes: Conjunctivae and EOM are normal. Pupils are equal, round, and reactive to light. No scleral icterus.  Neck: Normal range of motion.  Neck supple.  Cardiovascular: Regular rhythm, normal heart sounds and intact distal pulses.  Tachycardia present.   No murmur heard. Pulmonary/Chest: Effort normal and breath sounds normal. No respiratory distress. She has no wheezes. She has no rales.  Musculoskeletal: She exhibits no edema.  Lymphadenopathy:    She has no cervical adenopathy.  Skin: Skin is warm and dry. No rash noted.       Assessment & Plan:  GI onc- Dr. Monica Martinez at [facility] [phone]

## 2013-05-11 LAB — CULTURE, BLOOD (SINGLE): Organism ID, Bacteria: NO GROWTH

## 2013-06-25 ENCOUNTER — Ambulatory Visit (INDEPENDENT_AMBULATORY_CARE_PROVIDER_SITE_OTHER): Payer: Medicare Other | Admitting: Family Medicine

## 2013-06-25 ENCOUNTER — Encounter: Payer: Self-pay | Admitting: Family Medicine

## 2013-06-25 ENCOUNTER — Ambulatory Visit: Payer: Medicare Other | Admitting: Family Medicine

## 2013-06-25 VITALS — BP 122/60 | HR 104 | Temp 98.6°F | Wt 141.2 lb

## 2013-06-25 DIAGNOSIS — J111 Influenza due to unidentified influenza virus with other respiratory manifestations: Secondary | ICD-10-CM | POA: Insufficient documentation

## 2013-06-25 DIAGNOSIS — B9789 Other viral agents as the cause of diseases classified elsewhere: Principal | ICD-10-CM

## 2013-06-25 DIAGNOSIS — J069 Acute upper respiratory infection, unspecified: Secondary | ICD-10-CM

## 2013-06-25 LAB — POCT INFLUENZA A/B
Influenza A, POC: POSITIVE
Influenza B, POC: POSITIVE

## 2013-06-25 MED ORDER — OSELTAMIVIR PHOSPHATE 75 MG PO CAPS: 75.0000 mg | ORAL_CAPSULE | Freq: Two times a day (BID) | ORAL | Status: AC

## 2013-06-25 MED ORDER — AZITHROMYCIN 250 MG PO TABS: ORAL_TABLET | ORAL | Status: DC

## 2013-06-25 NOTE — Addendum Note (Signed)
Addended by: Ria Bush on: 06/25/2013 03:42 PM   Modules accepted: Orders, Medications

## 2013-06-25 NOTE — Progress Notes (Signed)
Pre-visit discussion using our clinic review tool. No additional management support is needed unless otherwise documented below in the visit note.  

## 2013-06-25 NOTE — Assessment & Plan Note (Addendum)
Initially anticipated this was more of a viral URI with cough, however as she did recently have flu shot this could be attenuated influenza - so flu swab obtained and positive Discussed supportive care, start tamiflu x5 days given comorbidities. Upcoming chemo treatment on Monday - advised to call them to let them know and likely reschedule Pt agrees with plan.

## 2013-06-25 NOTE — Addendum Note (Signed)
Addended by: Royann Shivers A on: 06/25/2013 03:35 PM   Modules accepted: Orders

## 2013-06-25 NOTE — Progress Notes (Signed)
   Subjective:    Patient ID: Whitney Meza, female    DOB: December 12, 1940, 73 y.o.   MRN: [id number]  HPI CC: fever  Pleasant but unfortunate 73 yo with h/o metastatic ileocecal adenocarcinoma with recent recurrence this fall now on 5FU as well as chemotherapy presents today with 5d h/o sore throat, abd discomfort and diarrhea, and now congestion.  Had some malaise.  Then fever to 102 yesterday morning.  Slept well last night.  Eyes burning and feels congested but endorses clear mucous from nose.  Cough present at night time and sneeze.  Flu shot 03/2013. Last chemo treatment was 2 wks ago - next scheduled appt is on Monday.  Past Medical History  Diagnosis Date  . Depression 12/17/89  . Diverticulosis of colon 1/04  . GERD (gastroesophageal reflux disease) 1/04  . HLD (hyperlipidemia) 06/1993  . Hyperthyroidism   . 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 Followed by [facility]  . Colostomy in place   . Colovesical fistula   . HTN (hypertension)   . Arthritis   . 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 Macular degeneration      Review of Systems Per HPI    Objective:   Physical Exam  Nursing note and vitals reviewed. Constitutional: She appears well-developed and well-nourished. No distress.  HENT:  Head: Normocephalic and atraumatic.  Right Ear: Hearing, tympanic membrane, external ear and ear canal normal.  Left Ear: Hearing, tympanic membrane, external ear and ear canal normal.  Nose: Rhinorrhea present. No mucosal edema. Right sinus exhibits no maxillary sinus tenderness and no frontal sinus tenderness. Left sinus exhibits no maxillary sinus tenderness and no frontal sinus tenderness.  Mouth/Throat: Uvula is midline, oropharynx is clear and moist and mucous membranes are normal. No oropharyngeal exudate, posterior oropharyngeal edema, posterior oropharyngeal erythema or tonsillar abscesses.    Eyes: Conjunctivae and EOM are normal. Pupils are equal, round, and reactive to light. No scleral icterus.  Neck: Normal range of motion. Neck supple.  Cardiovascular: Normal rate, regular rhythm, normal heart sounds and intact distal pulses.   No murmur heard. Pulmonary/Chest: Effort normal and breath sounds normal. No respiratory distress. She has no wheezes. She has no rales.  clear  Lymphadenopathy:    She has no cervical adenopathy.  Skin: Skin is warm and dry. No rash noted.       Assessment & Plan:

## 2013-06-25 NOTE — Patient Instructions (Addendum)
Flu swab today positive. Start tamiflu (sent to pharmacy). Treat with plenty of fluids and rest. If worsening cough or fever >101 persisting, please let me knwo Call chemo center to notify them that you do have the flu and may need to reschedule chemo. Good to see you, call us with questions.

## 2013-08-10 ENCOUNTER — Ambulatory Visit: Payer: Self-pay | Admitting: Oncology

## 2013-08-20 ENCOUNTER — Ambulatory Visit: Payer: Self-pay | Admitting: Oncology

## 2013-08-31 LAB — COMPREHENSIVE METABOLIC PANEL
ALBUMIN: 3 g/dL — AB (ref 3.4–5.0)
ALT: 25 U/L (ref 12–78)
Alkaline Phosphatase: 72 U/L
Anion Gap: 6 — ABNORMAL LOW (ref 7–16)
BILIRUBIN TOTAL: 0.6 mg/dL (ref 0.2–1.0)
BUN: 11 mg/dL (ref 7–18)
CALCIUM: 9.4 mg/dL (ref 8.5–10.1)
CO2: 29 mmol/L (ref 21–32)
CREATININE: 0.94 mg/dL (ref 0.60–1.30)
Chloride: 104 mmol/L (ref 98–107)
EGFR (African American): >60
EGFR (Non-African Amer.): >60
Glucose: 88 mg/dL (ref 65–99)
OSMOLALITY: 276 (ref 275–301)
Potassium: 3.8 mmol/L (ref 3.5–5.1)
SGOT(AST): 33 U/L (ref 15–37)
Sodium: 139 mmol/L (ref 136–145)
TOTAL PROTEIN: 6.7 g/dL (ref 6.4–8.2)

## 2013-08-31 LAB — CBC CANCER CENTER
Basophil #: 0.1 x10 3/mm (ref 0.0–0.1)
Basophil %: 0.9 %
EOS PCT: 2.7 %
Eosinophil #: 0.2 x10 3/mm (ref 0.0–0.7)
HCT: 35.9 % (ref 35.0–47.0)
HGB: 11.8 g/dL — ABNORMAL LOW (ref 12.0–16.0)
LYMPHS PCT: 29.3 %
Lymphocyte #: 1.7 x10 3/mm (ref 1.0–3.6)
MCH: 30.5 pg (ref 26.0–34.0)
MCHC: 33 g/dL (ref 32.0–36.0)
MCV: 92 fL (ref 80–100)
Monocyte #: 1 x10 3/mm — ABNORMAL HIGH (ref 0.2–0.9)
Monocyte %: 17.5 %
NEUTROS ABS: 2.9 x10 3/mm (ref 1.4–6.5)
Neutrophil %: 49.6 %
PLATELETS: 159 x10 3/mm (ref 150–440)
RBC: 3.89 10*6/uL (ref 3.80–5.20)
RDW: 17.4 % — AB (ref 11.5–14.5)
WBC: 5.8 x10 3/mm (ref 3.6–11.0)

## 2013-09-14 LAB — CBC CANCER CENTER
BASOS ABS: 0 x10 3/mm (ref 0.0–0.1)
Basophil %: 1.1 %
Eosinophil #: 0.1 x10 3/mm (ref 0.0–0.7)
Eosinophil %: 2.7 %
HCT: 35.5 % (ref 35.0–47.0)
HGB: 11.7 g/dL — ABNORMAL LOW (ref 12.0–16.0)
LYMPHS ABS: 1.1 x10 3/mm (ref 1.0–3.6)
Lymphocyte %: 32.4 %
MCH: 30.1 pg (ref 26.0–34.0)
MCHC: 32.9 g/dL (ref 32.0–36.0)
MCV: 92 fL (ref 80–100)
Monocyte #: 0.7 x10 3/mm (ref 0.2–0.9)
Monocyte %: 21.1 %
NEUTROS ABS: 1.5 x10 3/mm (ref 1.4–6.5)
Neutrophil %: 42.7 %
Platelet: 139 x10 3/mm — ABNORMAL LOW (ref 150–440)
RBC: 3.87 10*6/uL (ref 3.80–5.20)
RDW: 16.7 % — AB (ref 11.5–14.5)
WBC: 3.5 x10 3/mm — AB (ref 3.6–11.0)

## 2013-09-14 LAB — COMPREHENSIVE METABOLIC PANEL
ANION GAP: 8 (ref 7–16)
Albumin: 2.9 g/dL — ABNORMAL LOW (ref 3.4–5.0)
Alkaline Phosphatase: 79 U/L
BUN: 9 mg/dL (ref 7–18)
Bilirubin,Total: 0.3 mg/dL (ref 0.2–1.0)
CALCIUM: 9.6 mg/dL (ref 8.5–10.1)
Chloride: 106 mmol/L (ref 98–107)
Co2: 28 mmol/L (ref 21–32)
Creatinine: 0.84 mg/dL (ref 0.60–1.30)
EGFR (African American): >60
EGFR (Non-African Amer.): >60
GLUCOSE: 100 mg/dL — AB (ref 65–99)
Osmolality: 282 (ref 275–301)
Potassium: 3.8 mmol/L (ref 3.5–5.1)
SGOT(AST): 40 U/L — ABNORMAL HIGH (ref 15–37)
SGPT (ALT): 30 U/L (ref 12–78)
SODIUM: 142 mmol/L (ref 136–145)
Total Protein: 6.6 g/dL (ref 6.4–8.2)

## 2013-09-16 ENCOUNTER — Ambulatory Visit: Payer: Self-pay | Admitting: Oncology

## 2013-09-23 LAB — URINALYSIS, COMPLETE
BILIRUBIN, UR: NEGATIVE
Bacteria: NONE SEEN
Blood: NEGATIVE
GLUCOSE, UR: NEGATIVE mg/dL (ref 0–75)
Ketone: NEGATIVE
Leukocyte Esterase: NEGATIVE
NITRITE: NEGATIVE
Ph: 7 (ref 4.5–8.0)
Protein: NEGATIVE
Specific Gravity: 1.008 (ref 1.003–1.030)
Squamous Epithelial: <1

## 2013-09-24 LAB — URINE CULTURE

## 2013-09-28 LAB — CBC CANCER CENTER
Basophil #: 0 x10 3/mm (ref 0.0–0.1)
Basophil %: 1.3 %
EOS ABS: 0.1 x10 3/mm (ref 0.0–0.7)
Eosinophil %: 2.5 %
HCT: 35 % (ref 35.0–47.0)
HGB: 11.5 g/dL — AB (ref 12.0–16.0)
LYMPHS ABS: 1.3 x10 3/mm (ref 1.0–3.6)
Lymphocyte %: 46.5 %
MCH: 29.6 pg (ref 26.0–34.0)
MCHC: 32.8 g/dL (ref 32.0–36.0)
MCV: 90 fL (ref 80–100)
MONO ABS: 0.6 x10 3/mm (ref 0.2–0.9)
Monocyte %: 23.6 %
NEUTROS ABS: 0.7 x10 3/mm — AB (ref 1.4–6.5)
NEUTROS PCT: 26.1 %
Platelet: 139 x10 3/mm — ABNORMAL LOW (ref 150–440)
RBC: 3.87 10*6/uL (ref 3.80–5.20)
RDW: 17.1 % — AB (ref 11.5–14.5)
WBC: 2.7 x10 3/mm — ABNORMAL LOW (ref 3.6–11.0)

## 2013-09-28 LAB — COMPREHENSIVE METABOLIC PANEL
ALK PHOS: 79 U/L
ANION GAP: 8 (ref 7–16)
Albumin: 3.1 g/dL — ABNORMAL LOW (ref 3.4–5.0)
BUN: 12 mg/dL (ref 7–18)
Bilirubin,Total: 0.5 mg/dL (ref 0.2–1.0)
CALCIUM: 9.6 mg/dL (ref 8.5–10.1)
CHLORIDE: 106 mmol/L (ref 98–107)
CREATININE: 0.92 mg/dL (ref 0.60–1.30)
Co2: 28 mmol/L (ref 21–32)
EGFR (African American): >60
Glucose: 91 mg/dL (ref 65–99)
OSMOLALITY: 282 (ref 275–301)
Potassium: 3.4 mmol/L — ABNORMAL LOW (ref 3.5–5.1)
SGOT(AST): 38 U/L — ABNORMAL HIGH (ref 15–37)
SGPT (ALT): 29 U/L (ref 12–78)
SODIUM: 142 mmol/L (ref 136–145)
Total Protein: 6.8 g/dL (ref 6.4–8.2)

## 2013-10-05 LAB — CBC CANCER CENTER
Basophil #: 0 x10 3/mm (ref 0.0–0.1)
Basophil %: 2 %
EOS ABS: 0.1 x10 3/mm (ref 0.0–0.7)
Eosinophil %: 3.3 %
HCT: 35.9 % (ref 35.0–47.0)
HGB: 11.6 g/dL — AB (ref 12.0–16.0)
LYMPHS ABS: 1.1 x10 3/mm (ref 1.0–3.6)
LYMPHS PCT: 45.2 %
MCH: 29.4 pg (ref 26.0–34.0)
MCHC: 32.2 g/dL (ref 32.0–36.0)
MCV: 91 fL (ref 80–100)
Monocyte #: 0.6 x10 3/mm (ref 0.2–0.9)
Monocyte %: 26.8 %
Neutrophil #: 0.6 x10 3/mm — ABNORMAL LOW (ref 1.4–6.5)
Neutrophil %: 22.7 %
Platelet: 244 x10 3/mm (ref 150–440)
RBC: 3.93 10*6/uL (ref 3.80–5.20)
RDW: 16.7 % — AB (ref 11.5–14.5)
WBC: 2.4 x10 3/mm — AB (ref 3.6–11.0)

## 2013-10-12 LAB — COMPREHENSIVE METABOLIC PANEL
ALT: 25 U/L (ref 12–78)
Albumin: 3.2 g/dL — ABNORMAL LOW (ref 3.4–5.0)
Alkaline Phosphatase: 82 U/L
Anion Gap: 10 (ref 7–16)
BILIRUBIN TOTAL: 0.4 mg/dL (ref 0.2–1.0)
BUN: 8 mg/dL (ref 7–18)
CHLORIDE: 104 mmol/L (ref 98–107)
CREATININE: 0.81 mg/dL (ref 0.60–1.30)
Calcium, Total: 10.4 mg/dL — ABNORMAL HIGH (ref 8.5–10.1)
Co2: 28 mmol/L (ref 21–32)
EGFR (African American): >60
GLUCOSE: 100 mg/dL — AB (ref 65–99)
OSMOLALITY: 282 (ref 275–301)
Potassium: 3.3 mmol/L — ABNORMAL LOW (ref 3.5–5.1)
SGOT(AST): 38 U/L — ABNORMAL HIGH (ref 15–37)
SODIUM: 142 mmol/L (ref 136–145)
Total Protein: 7.2 g/dL (ref 6.4–8.2)

## 2013-10-12 LAB — CBC CANCER CENTER
BASOS ABS: 0.1 x10 3/mm (ref 0.0–0.1)
Basophil %: 1.2 %
Eosinophil #: 0.1 x10 3/mm (ref 0.0–0.7)
Eosinophil %: 1.5 %
HCT: 36.2 % (ref 35.0–47.0)
HGB: 11.8 g/dL — AB (ref 12.0–16.0)
LYMPHS PCT: 32.1 %
Lymphocyte #: 1.4 x10 3/mm (ref 1.0–3.6)
MCH: 29.2 pg (ref 26.0–34.0)
MCHC: 32.7 g/dL (ref 32.0–36.0)
MCV: 89 fL (ref 80–100)
MONO ABS: 0.6 x10 3/mm (ref 0.2–0.9)
Monocyte %: 15 %
Neutrophil #: 2.1 x10 3/mm (ref 1.4–6.5)
Neutrophil %: 50.2 %
PLATELETS: 230 x10 3/mm (ref 150–440)
RBC: 4.05 10*6/uL (ref 3.80–5.20)
RDW: 15.8 % — AB (ref 11.5–14.5)
WBC: 4.3 x10 3/mm (ref 3.6–11.0)

## 2013-10-13 LAB — CEA: CEA: 4.7 ng/mL (ref 0.0–4.7)

## 2013-10-14 ENCOUNTER — Encounter (HOSPITAL_COMMUNITY): Payer: Self-pay

## 2013-10-16 ENCOUNTER — Ambulatory Visit: Payer: Self-pay | Admitting: Oncology

## 2013-10-26 LAB — CBC CANCER CENTER
BASOS ABS: 0.2 x10 3/mm — AB (ref 0.0–0.1)
BASOS PCT: 2.4 %
Eosinophil #: 0.1 x10 3/mm (ref 0.0–0.7)
Eosinophil %: 0.9 %
HCT: 37.6 % (ref 35.0–47.0)
HGB: 12.3 g/dL (ref 12.0–16.0)
LYMPHS ABS: 1.5 x10 3/mm (ref 1.0–3.6)
Lymphocyte %: 16.2 %
MCH: 29.4 pg (ref 26.0–34.0)
MCHC: 32.8 g/dL (ref 32.0–36.0)
MCV: 90 fL (ref 80–100)
MONO ABS: 0.5 x10 3/mm (ref 0.2–0.9)
Monocyte %: 5.1 %
NEUTROS ABS: 7.2 x10 3/mm — AB (ref 1.4–6.5)
Neutrophil %: 75.4 %
Platelet: 192 x10 3/mm (ref 150–440)
RBC: 4.19 10*6/uL (ref 3.80–5.20)
RDW: 16.2 % — AB (ref 11.5–14.5)
WBC: 9.5 x10 3/mm (ref 3.6–11.0)

## 2013-10-26 LAB — COMPREHENSIVE METABOLIC PANEL
ALT: 21 U/L (ref 12–78)
Albumin: 3.2 g/dL — ABNORMAL LOW (ref 3.4–5.0)
Alkaline Phosphatase: 132 U/L — ABNORMAL HIGH
Anion Gap: 9 (ref 7–16)
BUN: 7 mg/dL (ref 7–18)
Bilirubin,Total: 0.3 mg/dL (ref 0.2–1.0)
CHLORIDE: 108 mmol/L — AB (ref 98–107)
Calcium, Total: 9.1 mg/dL (ref 8.5–10.1)
Co2: 29 mmol/L (ref 21–32)
Creatinine: 0.75 mg/dL (ref 0.60–1.30)
GLUCOSE: 110 mg/dL — AB (ref 65–99)
OSMOLALITY: 289 (ref 275–301)
Potassium: 3.1 mmol/L — ABNORMAL LOW (ref 3.5–5.1)
SGOT(AST): 28 U/L (ref 15–37)
SODIUM: 146 mmol/L — AB (ref 136–145)
Total Protein: 7.2 g/dL (ref 6.4–8.2)

## 2013-10-26 LAB — PROTEIN, URINE, RANDOM: Protein, Random Urine: 56 mg/dL — ABNORMAL HIGH (ref 0–12)

## 2013-11-04 ENCOUNTER — Inpatient Hospital Stay: Payer: Self-pay | Admitting: Internal Medicine

## 2013-11-04 LAB — COMPREHENSIVE METABOLIC PANEL
AST: 187 U/L — AB (ref 15–37)
Albumin: 3.4 g/dL (ref 3.4–5.0)
Alkaline Phosphatase: 259 U/L — ABNORMAL HIGH
Anion Gap: 8 (ref 7–16)
BUN: 8 mg/dL (ref 7–18)
Bilirubin,Total: 1.2 mg/dL — ABNORMAL HIGH (ref 0.2–1.0)
CO2: 32 mmol/L (ref 21–32)
Calcium, Total: 10.1 mg/dL (ref 8.5–10.1)
Chloride: 101 mmol/L (ref 98–107)
Creatinine: 0.76 mg/dL (ref 0.60–1.30)
GLUCOSE: 111 mg/dL — AB (ref 65–99)
OSMOLALITY: 280 (ref 275–301)
Potassium: 3.4 mmol/L — ABNORMAL LOW (ref 3.5–5.1)
SGPT (ALT): 129 U/L — ABNORMAL HIGH (ref 12–78)
SODIUM: 141 mmol/L (ref 136–145)
Total Protein: 7.6 g/dL (ref 6.4–8.2)

## 2013-11-04 LAB — MAGNESIUM: Magnesium: 1.6 mg/dL — ABNORMAL LOW

## 2013-11-04 LAB — CBC CANCER CENTER
BASOS PCT: 0.9 %
Basophil #: 0.1 x10 3/mm (ref 0.0–0.1)
EOS PCT: 0.8 %
Eosinophil #: 0.1 x10 3/mm (ref 0.0–0.7)
HCT: 39 % (ref 35.0–47.0)
HGB: 12.9 g/dL (ref 12.0–16.0)
LYMPHS PCT: 8.7 %
Lymphocyte #: 0.7 x10 3/mm — ABNORMAL LOW (ref 1.0–3.6)
MCH: 28.9 pg (ref 26.0–34.0)
MCHC: 33.1 g/dL (ref 32.0–36.0)
MCV: 87 fL (ref 80–100)
Monocyte #: 0.5 x10 3/mm (ref 0.2–0.9)
Monocyte %: 5.5 %
NEUTROS ABS: 7.1 x10 3/mm — AB (ref 1.4–6.5)
Neutrophil %: 84.1 %
Platelet: 176 x10 3/mm (ref 150–440)
RBC: 4.47 10*6/uL (ref 3.80–5.20)
RDW: 16 % — ABNORMAL HIGH (ref 11.5–14.5)
WBC: 8.5 x10 3/mm (ref 3.6–11.0)

## 2013-11-04 LAB — PROTIME-INR
INR: 0.9
Prothrombin Time: 12.3 secs (ref 11.5–14.7)

## 2013-11-04 LAB — APTT: Activated PTT: 27.1 secs (ref 23.6–35.9)

## 2013-11-05 LAB — COMPREHENSIVE METABOLIC PANEL
ALBUMIN: 2.6 g/dL — AB (ref 3.4–5.0)
Alkaline Phosphatase: 190 U/L — ABNORMAL HIGH
Anion Gap: 9 (ref 7–16)
BUN: 9 mg/dL (ref 7–18)
Bilirubin,Total: 0.5 mg/dL (ref 0.2–1.0)
CALCIUM: 8.7 mg/dL (ref 8.5–10.1)
CREATININE: 0.65 mg/dL (ref 0.60–1.30)
Chloride: 106 mmol/L (ref 98–107)
Co2: 27 mmol/L (ref 21–32)
EGFR (African American): >60
EGFR (Non-African Amer.): >60
Glucose: 92 mg/dL (ref 65–99)
OSMOLALITY: 281 (ref 275–301)
POTASSIUM: 3.5 mmol/L (ref 3.5–5.1)
SGOT(AST): 86 U/L — ABNORMAL HIGH (ref 15–37)
SGPT (ALT): 77 U/L (ref 12–78)
Sodium: 142 mmol/L (ref 136–145)
Total Protein: 5.8 g/dL — ABNORMAL LOW (ref 6.4–8.2)

## 2013-11-05 LAB — CBC WITH DIFFERENTIAL/PLATELET
BASOS PCT: 0.3 %
Basophil #: 0 10*3/uL (ref 0.0–0.1)
Eosinophil #: 0.1 10*3/uL (ref 0.0–0.7)
Eosinophil %: 1.2 %
HCT: 32.6 % — AB (ref 35.0–47.0)
HGB: 10.6 g/dL — ABNORMAL LOW (ref 12.0–16.0)
LYMPHS ABS: 1 10*3/uL (ref 1.0–3.6)
LYMPHS PCT: 16.4 %
MCH: 29.3 pg (ref 26.0–34.0)
MCHC: 32.5 g/dL (ref 32.0–36.0)
MCV: 90 fL (ref 80–100)
MONO ABS: 0.5 x10 3/mm (ref 0.2–0.9)
MONOS PCT: 8.1 %
NEUTROS ABS: 4.4 10*3/uL (ref 1.4–6.5)
Neutrophil %: 74 %
Platelet: 156 10*3/uL (ref 150–440)
RBC: 3.61 10*6/uL — AB (ref 3.80–5.20)
RDW: 16.2 % — ABNORMAL HIGH (ref 11.5–14.5)
WBC: 6 10*3/uL (ref 3.6–11.0)

## 2013-11-05 LAB — MAGNESIUM: Magnesium: 1.9 mg/dL

## 2013-11-06 LAB — CBC WITH DIFFERENTIAL/PLATELET
Basophil #: 0 10*3/uL (ref 0.0–0.1)
Basophil %: 0.6 %
Eosinophil #: 0.1 10*3/uL (ref 0.0–0.7)
Eosinophil %: 2 %
HCT: 31.3 % — AB (ref 35.0–47.0)
HGB: 10.4 g/dL — ABNORMAL LOW (ref 12.0–16.0)
LYMPHS PCT: 20.7 %
Lymphocyte #: 1.2 10*3/uL (ref 1.0–3.6)
MCH: 30 pg (ref 26.0–34.0)
MCHC: 33.3 g/dL (ref 32.0–36.0)
MCV: 90 fL (ref 80–100)
MONOS PCT: 8.1 %
Monocyte #: 0.5 x10 3/mm (ref 0.2–0.9)
NEUTROS ABS: 4 10*3/uL (ref 1.4–6.5)
Neutrophil %: 68.6 %
PLATELETS: 154 10*3/uL (ref 150–440)
RBC: 3.47 10*6/uL — ABNORMAL LOW (ref 3.80–5.20)
RDW: 16.1 % — ABNORMAL HIGH (ref 11.5–14.5)
WBC: 5.8 10*3/uL (ref 3.6–11.0)

## 2013-11-06 LAB — COMPREHENSIVE METABOLIC PANEL
Albumin: 2.3 g/dL — ABNORMAL LOW (ref 3.4–5.0)
Alkaline Phosphatase: 158 U/L — ABNORMAL HIGH
Anion Gap: 7 (ref 7–16)
BUN: 6 mg/dL — ABNORMAL LOW (ref 7–18)
Bilirubin,Total: 0.4 mg/dL (ref 0.2–1.0)
CALCIUM: 8.1 mg/dL — AB (ref 8.5–10.1)
CREATININE: 0.55 mg/dL — AB (ref 0.60–1.30)
Chloride: 109 mmol/L — ABNORMAL HIGH (ref 98–107)
Co2: 27 mmol/L (ref 21–32)
EGFR (African American): >60
GLUCOSE: 94 mg/dL (ref 65–99)
Osmolality: 282 (ref 275–301)
POTASSIUM: 3.3 mmol/L — AB (ref 3.5–5.1)
SGOT(AST): 54 U/L — ABNORMAL HIGH (ref 15–37)
SGPT (ALT): 54 U/L (ref 12–78)
SODIUM: 143 mmol/L (ref 136–145)
TOTAL PROTEIN: 5.3 g/dL — AB (ref 6.4–8.2)

## 2013-11-06 LAB — URINE CULTURE

## 2013-11-08 LAB — CBC WITH DIFFERENTIAL/PLATELET
Bands: 4 %
Basophil: 1 %
Eosinophil: 2 %
HCT: 28.3 % — AB (ref 35.0–47.0)
HGB: 9.1 g/dL — ABNORMAL LOW (ref 12.0–16.0)
LYMPHS PCT: 20 %
MCH: 29 pg (ref 26.0–34.0)
MCHC: 32.2 g/dL (ref 32.0–36.0)
MCV: 90 fL (ref 80–100)
Monocytes: 3 %
Myelocyte: 1 %
Platelet: 146 10*3/uL — ABNORMAL LOW (ref 150–440)
RBC: 3.13 10*6/uL — AB (ref 3.80–5.20)
RDW: 15.7 % — ABNORMAL HIGH (ref 11.5–14.5)
SEGMENTED NEUTROPHILS: 69 %
WBC: 7.8 10*3/uL (ref 3.6–11.0)

## 2013-11-08 LAB — BASIC METABOLIC PANEL
Anion Gap: 4 — ABNORMAL LOW (ref 7–16)
BUN: 2 mg/dL — AB (ref 7–18)
CO2: 29 mmol/L (ref 21–32)
Calcium, Total: 8.6 mg/dL (ref 8.5–10.1)
Chloride: 110 mmol/L — ABNORMAL HIGH (ref 98–107)
Creatinine: 0.69 mg/dL (ref 0.60–1.30)
EGFR (Non-African Amer.): >60
GLUCOSE: 98 mg/dL (ref 65–99)
Osmolality: 281 (ref 275–301)
POTASSIUM: 2.9 mmol/L — AB (ref 3.5–5.1)
SODIUM: 143 mmol/L (ref 136–145)

## 2013-11-08 LAB — POTASSIUM: POTASSIUM: 3.3 mmol/L — AB (ref 3.5–5.1)

## 2013-11-09 LAB — CBC WITH DIFFERENTIAL/PLATELET
Bands: 10 %
Eosinophil: 1 %
HCT: 34.8 % — ABNORMAL LOW (ref 35.0–47.0)
HGB: 11.2 g/dL — ABNORMAL LOW (ref 12.0–16.0)
Lymphocytes: 12 %
MCH: 28.9 pg (ref 26.0–34.0)
MCHC: 32.2 g/dL (ref 32.0–36.0)
MCV: 90 fL (ref 80–100)
METAMYELOCYTE: 4 %
Monocytes: 5 %
Myelocyte: 2 %
Platelet: 189 10*3/uL (ref 150–440)
RBC: 3.89 10*6/uL (ref 3.80–5.20)
RDW: 15.8 % — AB (ref 11.5–14.5)
Segmented Neutrophils: 66 %
WBC: 10.4 10*3/uL (ref 3.6–11.0)

## 2013-11-09 LAB — BASIC METABOLIC PANEL
Anion Gap: 6 — ABNORMAL LOW (ref 7–16)
BUN: 3 mg/dL — ABNORMAL LOW (ref 7–18)
Calcium, Total: 8.7 mg/dL (ref 8.5–10.1)
Chloride: 107 mmol/L (ref 98–107)
Co2: 29 mmol/L (ref 21–32)
Creatinine: 0.59 mg/dL — ABNORMAL LOW (ref 0.60–1.30)
EGFR (African American): >60
EGFR (Non-African Amer.): >60
Glucose: 100 mg/dL — ABNORMAL HIGH (ref 65–99)
Osmolality: 280 (ref 275–301)
Potassium: 3.2 mmol/L — ABNORMAL LOW (ref 3.5–5.1)
Sodium: 142 mmol/L (ref 136–145)

## 2013-11-09 LAB — CULTURE, BLOOD (SINGLE)

## 2013-11-11 LAB — COMPREHENSIVE METABOLIC PANEL
ANION GAP: 6 — AB (ref 7–16)
Albumin: 3 g/dL — ABNORMAL LOW (ref 3.4–5.0)
Alkaline Phosphatase: 149 U/L — ABNORMAL HIGH
BUN: 5 mg/dL — ABNORMAL LOW (ref 7–18)
Bilirubin,Total: 0.5 mg/dL (ref 0.2–1.0)
CALCIUM: 9.7 mg/dL (ref 8.5–10.1)
CHLORIDE: 103 mmol/L (ref 98–107)
CO2: 30 mmol/L (ref 21–32)
Creatinine: 0.73 mg/dL (ref 0.60–1.30)
EGFR (African American): >60
Glucose: 95 mg/dL (ref 65–99)
Osmolality: 275 (ref 275–301)
POTASSIUM: 3 mmol/L — AB (ref 3.5–5.1)
SGOT(AST): 33 U/L (ref 15–37)
SGPT (ALT): 27 U/L (ref 12–78)
Sodium: 139 mmol/L (ref 136–145)
Total Protein: 6.6 g/dL (ref 6.4–8.2)

## 2013-11-11 LAB — CBC CANCER CENTER
Basophil #: 0.1 x10 3/mm (ref 0.0–0.1)
Basophil %: 0.6 %
Eosinophil #: 0.1 x10 3/mm (ref 0.0–0.7)
Eosinophil %: 0.7 %
HCT: 37.5 % (ref 35.0–47.0)
HGB: 12.1 g/dL (ref 12.0–16.0)
LYMPHS ABS: 1.4 x10 3/mm (ref 1.0–3.6)
Lymphocyte %: 11.7 %
MCH: 28.8 pg (ref 26.0–34.0)
MCHC: 32.3 g/dL (ref 32.0–36.0)
MCV: 89 fL (ref 80–100)
MONO ABS: 0.6 x10 3/mm (ref 0.2–0.9)
Monocyte %: 4.9 %
NEUTROS PCT: 82.1 %
Neutrophil #: 9.6 x10 3/mm — ABNORMAL HIGH (ref 1.4–6.5)
PLATELETS: 195 x10 3/mm (ref 150–440)
RBC: 4.2 10*6/uL (ref 3.80–5.20)
RDW: 15.8 % — ABNORMAL HIGH (ref 11.5–14.5)
WBC: 11.7 x10 3/mm — ABNORMAL HIGH (ref 3.6–11.0)

## 2013-11-16 ENCOUNTER — Ambulatory Visit: Payer: Self-pay | Admitting: Oncology

## 2013-11-17 LAB — CBC CANCER CENTER
Basophil #: 0.1 x10 3/mm (ref 0.0–0.1)
Basophil %: 1.1 %
EOS PCT: 1 %
Eosinophil #: 0.1 x10 3/mm (ref 0.0–0.7)
HCT: 38 % (ref 35.0–47.0)
HGB: 12.3 g/dL (ref 12.0–16.0)
LYMPHS PCT: 28 %
Lymphocyte #: 1.5 x10 3/mm (ref 1.0–3.6)
MCH: 28.8 pg (ref 26.0–34.0)
MCHC: 32.3 g/dL (ref 32.0–36.0)
MCV: 89 fL (ref 80–100)
Monocyte #: 0.6 x10 3/mm (ref 0.2–0.9)
Monocyte %: 10.8 %
NEUTROS PCT: 59.1 %
Neutrophil #: 3.1 x10 3/mm (ref 1.4–6.5)
Platelet: 209 x10 3/mm (ref 150–440)
RBC: 4.27 10*6/uL (ref 3.80–5.20)
RDW: 16.3 % — AB (ref 11.5–14.5)
WBC: 5.3 x10 3/mm (ref 3.6–11.0)

## 2013-11-17 LAB — COMPREHENSIVE METABOLIC PANEL
ALBUMIN: 3.2 g/dL — AB (ref 3.4–5.0)
ALK PHOS: 111 U/L
ANION GAP: 9 (ref 7–16)
AST: 26 U/L (ref 15–37)
BILIRUBIN TOTAL: 0.4 mg/dL (ref 0.2–1.0)
BUN: 8 mg/dL (ref 7–18)
Calcium, Total: 9.7 mg/dL (ref 8.5–10.1)
Chloride: 104 mmol/L (ref 98–107)
Co2: 28 mmol/L (ref 21–32)
Creatinine: 0.74 mg/dL (ref 0.60–1.30)
EGFR (Non-African Amer.): >60
Glucose: 104 mg/dL — ABNORMAL HIGH (ref 65–99)
OSMOLALITY: 280 (ref 275–301)
Potassium: 4 mmol/L (ref 3.5–5.1)
SGPT (ALT): 19 U/L (ref 12–78)
Sodium: 141 mmol/L (ref 136–145)
Total Protein: 7.1 g/dL (ref 6.4–8.2)

## 2013-11-25 LAB — CBC CANCER CENTER
BASOS PCT: 1.7 %
Basophil #: 0.1 x10 3/mm (ref 0.0–0.1)
Eosinophil #: 0 x10 3/mm (ref 0.0–0.7)
Eosinophil %: 0.7 %
HCT: 37.3 % (ref 35.0–47.0)
HGB: 12 g/dL (ref 12.0–16.0)
Lymphocyte #: 1.4 x10 3/mm (ref 1.0–3.6)
Lymphocyte %: 38 %
MCH: 28.5 pg (ref 26.0–34.0)
MCHC: 32.3 g/dL (ref 32.0–36.0)
MCV: 88 fL (ref 80–100)
Monocyte #: 0.6 x10 3/mm (ref 0.2–0.9)
Monocyte %: 15.5 %
NEUTROS ABS: 1.6 x10 3/mm (ref 1.4–6.5)
Neutrophil %: 44.1 %
Platelet: 217 x10 3/mm (ref 150–440)
RBC: 4.22 10*6/uL (ref 3.80–5.20)
RDW: 16.4 % — ABNORMAL HIGH (ref 11.5–14.5)
WBC: 3.7 x10 3/mm (ref 3.6–11.0)

## 2013-11-25 LAB — COMPREHENSIVE METABOLIC PANEL
ALBUMIN: 3.3 g/dL — AB (ref 3.4–5.0)
ALK PHOS: 86 U/L
ALT: 22 U/L (ref 12–78)
Anion Gap: 8 (ref 7–16)
BILIRUBIN TOTAL: 0.6 mg/dL (ref 0.2–1.0)
BUN: 12 mg/dL (ref 7–18)
CHLORIDE: 103 mmol/L (ref 98–107)
Calcium, Total: 10.1 mg/dL (ref 8.5–10.1)
Co2: 27 mmol/L (ref 21–32)
Creatinine: 0.86 mg/dL (ref 0.60–1.30)
EGFR (African American): >60
GLUCOSE: 97 mg/dL (ref 65–99)
OSMOLALITY: 275 (ref 275–301)
Potassium: 3.9 mmol/L (ref 3.5–5.1)
SGOT(AST): 31 U/L (ref 15–37)
Sodium: 138 mmol/L (ref 136–145)
Total Protein: 7.2 g/dL (ref 6.4–8.2)

## 2013-12-09 LAB — CBC CANCER CENTER
Basophil #: 0.1 x10 3/mm (ref 0.0–0.1)
Basophil %: 0.8 %
Eosinophil #: 0.2 x10 3/mm (ref 0.0–0.7)
Eosinophil %: 1.8 %
HCT: 35.6 % (ref 35.0–47.0)
HGB: 11.8 g/dL — ABNORMAL LOW (ref 12.0–16.0)
Lymphocyte #: 1.7 x10 3/mm (ref 1.0–3.6)
Lymphocyte %: 18.4 %
MCH: 28.6 pg (ref 26.0–34.0)
MCHC: 33.1 g/dL (ref 32.0–36.0)
MCV: 86 fL (ref 80–100)
MONOS PCT: 6.7 %
Monocyte #: 0.6 x10 3/mm (ref 0.2–0.9)
NEUTROS PCT: 72.3 %
Neutrophil #: 6.5 x10 3/mm (ref 1.4–6.5)
Platelet: 176 x10 3/mm (ref 150–440)
RBC: 4.12 10*6/uL (ref 3.80–5.20)
RDW: 16.9 % — AB (ref 11.5–14.5)
WBC: 9 x10 3/mm (ref 3.6–11.0)

## 2013-12-09 LAB — COMPREHENSIVE METABOLIC PANEL
ALK PHOS: 98 U/L
Albumin: 3.5 g/dL (ref 3.4–5.0)
Anion Gap: 8 (ref 7–16)
BUN: 12 mg/dL (ref 7–18)
Bilirubin,Total: 0.4 mg/dL (ref 0.2–1.0)
CREATININE: 0.88 mg/dL (ref 0.60–1.30)
Calcium, Total: 10.2 mg/dL — ABNORMAL HIGH (ref 8.5–10.1)
Chloride: 104 mmol/L (ref 98–107)
Co2: 28 mmol/L (ref 21–32)
EGFR (African American): >60
EGFR (Non-African Amer.): >60
GLUCOSE: 93 mg/dL (ref 65–99)
Osmolality: 279 (ref 275–301)
Potassium: 3.3 mmol/L — ABNORMAL LOW (ref 3.5–5.1)
SGOT(AST): 23 U/L (ref 15–37)
SGPT (ALT): 20 U/L (ref 12–78)
Sodium: 140 mmol/L (ref 136–145)
Total Protein: 6.9 g/dL (ref 6.4–8.2)

## 2013-12-15 ENCOUNTER — Emergency Department: Payer: Self-pay | Admitting: Emergency Medicine

## 2013-12-15 ENCOUNTER — Inpatient Hospital Stay: Payer: Self-pay | Admitting: Internal Medicine

## 2013-12-15 LAB — COMPREHENSIVE METABOLIC PANEL
Albumin: 3.6 g/dL (ref 3.4–5.0)
Alkaline Phosphatase: 163 U/L — ABNORMAL HIGH
Anion Gap: 12 (ref 7–16)
BUN: 13 mg/dL (ref 7–18)
Bilirubin,Total: 0.4 mg/dL (ref 0.2–1.0)
CALCIUM: 10.3 mg/dL — AB (ref 8.5–10.1)
CO2: 26 mmol/L (ref 21–32)
Chloride: 104 mmol/L (ref 98–107)
Creatinine: 0.84 mg/dL (ref 0.60–1.30)
EGFR (Non-African Amer.): >60
GLUCOSE: 117 mg/dL — AB (ref 65–99)
OSMOLALITY: 284 (ref 275–301)
POTASSIUM: 3.3 mmol/L — AB (ref 3.5–5.1)
SGOT(AST): 20 U/L (ref 15–37)
SGPT (ALT): 18 U/L (ref 12–78)
Sodium: 142 mmol/L (ref 136–145)
Total Protein: 7 g/dL (ref 6.4–8.2)

## 2013-12-15 LAB — CBC WITH DIFFERENTIAL/PLATELET
BASOS ABS: 0 10*3/uL (ref 0.0–0.1)
BASOS PCT: 0.2 %
EOS ABS: 0.1 10*3/uL (ref 0.0–0.7)
Eosinophil %: 0.3 %
HCT: 36.6 % (ref 35.0–47.0)
HGB: 11.4 g/dL — AB (ref 12.0–16.0)
LYMPHS ABS: 1.2 10*3/uL (ref 1.0–3.6)
LYMPHS PCT: 5.9 %
MCH: 27.4 pg (ref 26.0–34.0)
MCHC: 31.3 g/dL — AB (ref 32.0–36.0)
MCV: 88 fL (ref 80–100)
Monocyte #: 0.5 x10 3/mm (ref 0.2–0.9)
Monocyte %: 2.6 %
NEUTROS ABS: 18.5 10*3/uL — AB (ref 1.4–6.5)
Neutrophil %: 91 %
Platelet: 160 10*3/uL (ref 150–440)
RBC: 4.18 10*6/uL (ref 3.80–5.20)
RDW: 17.2 % — ABNORMAL HIGH (ref 11.5–14.5)
WBC: 20.3 10*3/uL — AB (ref 3.6–11.0)

## 2013-12-15 LAB — LIPASE, BLOOD: Lipase: 242 U/L (ref 73–393)

## 2013-12-16 ENCOUNTER — Ambulatory Visit: Payer: Self-pay | Admitting: Oncology

## 2013-12-16 LAB — BASIC METABOLIC PANEL
Anion Gap: 4 — ABNORMAL LOW (ref 7–16)
BUN: 10 mg/dL (ref 7–18)
CREATININE: 0.77 mg/dL (ref 0.60–1.30)
Calcium, Total: 9.1 mg/dL (ref 8.5–10.1)
Chloride: 108 mmol/L — ABNORMAL HIGH (ref 98–107)
Co2: 28 mmol/L (ref 21–32)
EGFR (African American): >60
EGFR (Non-African Amer.): >60
Glucose: 115 mg/dL — ABNORMAL HIGH (ref 65–99)
Osmolality: 279 (ref 275–301)
Potassium: 3.3 mmol/L — ABNORMAL LOW (ref 3.5–5.1)
Sodium: 140 mmol/L (ref 136–145)

## 2013-12-16 LAB — CBC WITH DIFFERENTIAL/PLATELET
BASOS ABS: 0 10*3/uL (ref 0.0–0.1)
BASOS PCT: 0.4 %
EOS PCT: 1.5 %
Eosinophil #: 0.1 10*3/uL (ref 0.0–0.7)
HCT: 30.1 % — AB (ref 35.0–47.0)
HGB: 10 g/dL — AB (ref 12.0–16.0)
LYMPHS PCT: 10.2 %
Lymphocyte #: 0.9 10*3/uL — ABNORMAL LOW (ref 1.0–3.6)
MCH: 29.1 pg (ref 26.0–34.0)
MCHC: 33.3 g/dL (ref 32.0–36.0)
MCV: 87 fL (ref 80–100)
MONOS PCT: 5.6 %
Monocyte #: 0.5 x10 3/mm (ref 0.2–0.9)
NEUTROS ABS: 7.2 10*3/uL — AB (ref 1.4–6.5)
Neutrophil %: 82.3 %
PLATELETS: 123 10*3/uL — AB (ref 150–440)
RBC: 3.45 10*6/uL — AB (ref 3.80–5.20)
RDW: 17.1 % — AB (ref 11.5–14.5)
WBC: 8.8 10*3/uL (ref 3.6–11.0)

## 2013-12-16 LAB — MAGNESIUM: MAGNESIUM: 1.5 mg/dL — AB

## 2013-12-17 LAB — URINALYSIS, COMPLETE
Bacteria: NONE SEEN
Bilirubin,UR: NEGATIVE
Blood: NEGATIVE
GLUCOSE, UR: NEGATIVE mg/dL (ref 0–75)
KETONE: NEGATIVE
Nitrite: NEGATIVE
PH: 5 (ref 4.5–8.0)
PROTEIN: NEGATIVE
RBC,UR: 8 /HPF (ref 0–5)
SPECIFIC GRAVITY: 1.019 (ref 1.003–1.030)
Squamous Epithelial: 1
WBC UR: NONE SEEN /HPF (ref 0–5)

## 2013-12-17 LAB — CBC WITH DIFFERENTIAL/PLATELET
Basophil #: 0 10*3/uL (ref 0.0–0.1)
Basophil %: 0.2 %
EOS ABS: 0 10*3/uL (ref 0.0–0.7)
Eosinophil %: 0.6 %
HCT: 30.4 % — AB (ref 35.0–47.0)
HGB: 10 g/dL — ABNORMAL LOW (ref 12.0–16.0)
LYMPHS ABS: 1 10*3/uL (ref 1.0–3.6)
LYMPHS PCT: 11.7 %
MCH: 28.7 pg (ref 26.0–34.0)
MCHC: 32.8 g/dL (ref 32.0–36.0)
MCV: 88 fL (ref 80–100)
MONO ABS: 0.6 x10 3/mm (ref 0.2–0.9)
MONOS PCT: 7.2 %
NEUTROS PCT: 80.3 %
Neutrophil #: 6.8 10*3/uL — ABNORMAL HIGH (ref 1.4–6.5)
Platelet: 122 10*3/uL — ABNORMAL LOW (ref 150–440)
RBC: 3.47 10*6/uL — AB (ref 3.80–5.20)
RDW: 17.5 % — ABNORMAL HIGH (ref 11.5–14.5)
WBC: 8.5 10*3/uL (ref 3.6–11.0)

## 2013-12-17 LAB — POTASSIUM: POTASSIUM: 3.5 mmol/L (ref 3.5–5.1)

## 2013-12-17 LAB — PATHOLOGY REPORT

## 2013-12-18 DIAGNOSIS — K56609 Unspecified intestinal obstruction, unspecified as to partial versus complete obstruction: Secondary | ICD-10-CM

## 2013-12-18 LAB — COMPREHENSIVE METABOLIC PANEL
Albumin: 2.5 g/dL — ABNORMAL LOW (ref 3.4–5.0)
Alkaline Phosphatase: 140 U/L — ABNORMAL HIGH
Anion Gap: 7 (ref 7–16)
BILIRUBIN TOTAL: 0.3 mg/dL (ref 0.2–1.0)
BUN: 9 mg/dL (ref 7–18)
CALCIUM: 9.1 mg/dL (ref 8.5–10.1)
CREATININE: 0.8 mg/dL (ref 0.60–1.30)
Chloride: 108 mmol/L — ABNORMAL HIGH (ref 98–107)
Co2: 25 mmol/L (ref 21–32)
Glucose: 103 mg/dL — ABNORMAL HIGH (ref 65–99)
Osmolality: 278 (ref 275–301)
Potassium: 3.2 mmol/L — ABNORMAL LOW (ref 3.5–5.1)
SGOT(AST): 25 U/L (ref 15–37)
SGPT (ALT): 50 U/L (ref 12–78)
SODIUM: 140 mmol/L (ref 136–145)
Total Protein: 5.5 g/dL — ABNORMAL LOW (ref 6.4–8.2)

## 2013-12-18 LAB — CBC WITH DIFFERENTIAL/PLATELET
BASOS PCT: 0.2 %
Basophil #: 0 10*3/uL (ref 0.0–0.1)
Eosinophil #: 0.1 10*3/uL (ref 0.0–0.7)
Eosinophil %: 0.8 %
HCT: 28.2 % — ABNORMAL LOW (ref 35.0–47.0)
HGB: 9.3 g/dL — ABNORMAL LOW (ref 12.0–16.0)
Lymphocyte #: 0.7 10*3/uL — ABNORMAL LOW (ref 1.0–3.6)
Lymphocyte %: 7.1 %
MCH: 29.2 pg (ref 26.0–34.0)
MCHC: 33.1 g/dL (ref 32.0–36.0)
MCV: 88 fL (ref 80–100)
MONO ABS: 0.6 x10 3/mm (ref 0.2–0.9)
MONOS PCT: 6 %
NEUTROS ABS: 7.9 10*3/uL — AB (ref 1.4–6.5)
NEUTROS PCT: 85.9 %
Platelet: 140 10*3/uL — ABNORMAL LOW (ref 150–440)
RBC: 3.2 10*6/uL — ABNORMAL LOW (ref 3.80–5.20)
RDW: 17.6 % — ABNORMAL HIGH (ref 11.5–14.5)
WBC: 9.3 10*3/uL (ref 3.6–11.0)

## 2013-12-19 LAB — URINE CULTURE

## 2013-12-22 ENCOUNTER — Ambulatory Visit: Payer: Self-pay | Admitting: Oncology

## 2013-12-22 LAB — CULTURE, BLOOD (SINGLE)

## 2013-12-30 LAB — CBC CANCER CENTER
BASOS ABS: 0.1 x10 3/mm (ref 0.0–0.1)
BASOS PCT: 0.7 %
EOS ABS: 0 x10 3/mm (ref 0.0–0.7)
Eosinophil %: 0.5 %
HCT: 38.2 % (ref 35.0–47.0)
HGB: 12.2 g/dL (ref 12.0–16.0)
Lymphocyte #: 1.3 x10 3/mm (ref 1.0–3.6)
Lymphocyte %: 18.8 %
MCH: 28.1 pg (ref 26.0–34.0)
MCHC: 31.9 g/dL — ABNORMAL LOW (ref 32.0–36.0)
MCV: 88 fL (ref 80–100)
MONOS PCT: 8.8 %
Monocyte #: 0.6 x10 3/mm (ref 0.2–0.9)
NEUTROS PCT: 71.2 %
Neutrophil #: 5 x10 3/mm (ref 1.4–6.5)
Platelet: 312 x10 3/mm (ref 150–440)
RBC: 4.34 10*6/uL (ref 3.80–5.20)
RDW: 18.6 % — ABNORMAL HIGH (ref 11.5–14.5)
WBC: 7 x10 3/mm (ref 3.6–11.0)

## 2013-12-30 LAB — COMPREHENSIVE METABOLIC PANEL
ALT: 21 U/L (ref 12–78)
Albumin: 3.2 g/dL — ABNORMAL LOW (ref 3.4–5.0)
Alkaline Phosphatase: 85 U/L
Anion Gap: 7 (ref 7–16)
BUN: 9 mg/dL (ref 7–18)
Bilirubin,Total: 0.3 mg/dL (ref 0.2–1.0)
Calcium, Total: 10.3 mg/dL — ABNORMAL HIGH (ref 8.5–10.1)
Chloride: 103 mmol/L (ref 98–107)
Co2: 34 mmol/L — ABNORMAL HIGH (ref 21–32)
Creatinine: 0.86 mg/dL (ref 0.60–1.30)
EGFR (African American): >60
GLUCOSE: 130 mg/dL — AB (ref 65–99)
Osmolality: 287 (ref 275–301)
Potassium: 3.1 mmol/L — ABNORMAL LOW (ref 3.5–5.1)
SGOT(AST): 25 U/L (ref 15–37)
SODIUM: 144 mmol/L (ref 136–145)
Total Protein: 7.2 g/dL (ref 6.4–8.2)

## 2014-01-07 LAB — CBC CANCER CENTER
Basophil #: 0 x10 3/mm (ref 0.0–0.1)
Basophil %: 0.9 %
Eosinophil #: 0.1 x10 3/mm (ref 0.0–0.7)
Eosinophil %: 1.7 %
HCT: 34.4 % — ABNORMAL LOW (ref 35.0–47.0)
HGB: 10.9 g/dL — ABNORMAL LOW (ref 12.0–16.0)
LYMPHS PCT: 22.3 %
Lymphocyte #: 1.1 x10 3/mm (ref 1.0–3.6)
MCH: 28 pg (ref 26.0–34.0)
MCHC: 31.8 g/dL — ABNORMAL LOW (ref 32.0–36.0)
MCV: 88 fL (ref 80–100)
Monocyte #: 0.5 x10 3/mm (ref 0.2–0.9)
Monocyte %: 9.8 %
Neutrophil #: 3.3 x10 3/mm (ref 1.4–6.5)
Neutrophil %: 65.3 %
Platelet: 216 x10 3/mm (ref 150–440)
RBC: 3.9 10*6/uL (ref 3.80–5.20)
RDW: 19.1 % — AB (ref 11.5–14.5)
WBC: 5.1 x10 3/mm (ref 3.6–11.0)

## 2014-01-07 LAB — BASIC METABOLIC PANEL
Anion Gap: 7 (ref 7–16)
BUN: 7 mg/dL (ref 7–18)
Calcium, Total: 9.5 mg/dL (ref 8.5–10.1)
Chloride: 104 mmol/L (ref 98–107)
Co2: 30 mmol/L (ref 21–32)
Creatinine: 0.75 mg/dL (ref 0.60–1.30)
EGFR (African American): >60
EGFR (Non-African Amer.): >60
Glucose: 121 mg/dL — ABNORMAL HIGH (ref 65–99)
Osmolality: 280 (ref 275–301)
Potassium: 2.9 mmol/L — ABNORMAL LOW (ref 3.5–5.1)
Sodium: 141 mmol/L (ref 136–145)

## 2014-01-16 ENCOUNTER — Ambulatory Visit: Payer: Self-pay | Admitting: Oncology

## 2014-01-25 LAB — CBC CANCER CENTER
Basophil #: 0.1 x10 3/mm (ref 0.0–0.1)
Basophil %: 1 %
EOS ABS: 0.2 x10 3/mm (ref 0.0–0.7)
Eosinophil %: 2 %
HCT: 33.7 % — AB (ref 35.0–47.0)
HGB: 10.8 g/dL — ABNORMAL LOW (ref 12.0–16.0)
LYMPHS PCT: 22.2 %
Lymphocyte #: 1.7 x10 3/mm (ref 1.0–3.6)
MCH: 28.4 pg (ref 26.0–34.0)
MCHC: 32.1 g/dL (ref 32.0–36.0)
MCV: 88 fL (ref 80–100)
MONOS PCT: 9.7 %
Monocyte #: 0.7 x10 3/mm (ref 0.2–0.9)
Neutrophil #: 4.9 x10 3/mm (ref 1.4–6.5)
Neutrophil %: 65.1 %
Platelet: 187 x10 3/mm (ref 150–440)
RBC: 3.82 10*6/uL (ref 3.80–5.20)
RDW: 18 % — ABNORMAL HIGH (ref 11.5–14.5)
WBC: 7.6 x10 3/mm (ref 3.6–11.0)

## 2014-01-25 LAB — COMPREHENSIVE METABOLIC PANEL
ALK PHOS: 104 U/L
ANION GAP: 9 (ref 7–16)
Albumin: 3.2 g/dL — ABNORMAL LOW (ref 3.4–5.0)
BILIRUBIN TOTAL: 0.3 mg/dL (ref 0.2–1.0)
BUN: 7 mg/dL (ref 7–18)
CHLORIDE: 102 mmol/L (ref 98–107)
CREATININE: 0.87 mg/dL (ref 0.60–1.30)
Calcium, Total: 9.9 mg/dL (ref 8.5–10.1)
Co2: 29 mmol/L (ref 21–32)
EGFR (Non-African Amer.): >60
Glucose: 101 mg/dL — ABNORMAL HIGH (ref 65–99)
OSMOLALITY: 278 (ref 275–301)
Potassium: 3.4 mmol/L — ABNORMAL LOW (ref 3.5–5.1)
SGOT(AST): 26 U/L (ref 15–37)
SGPT (ALT): 22 U/L
Sodium: 140 mmol/L (ref 136–145)
TOTAL PROTEIN: 6.7 g/dL (ref 6.4–8.2)

## 2014-02-08 LAB — COMPREHENSIVE METABOLIC PANEL
ALK PHOS: 126 U/L — AB
ALT: 21 U/L
Albumin: 3.2 g/dL — ABNORMAL LOW (ref 3.4–5.0)
Anion Gap: 8 (ref 7–16)
BUN: 7 mg/dL (ref 7–18)
Bilirubin,Total: 0.4 mg/dL (ref 0.2–1.0)
CALCIUM: 9.7 mg/dL (ref 8.5–10.1)
CO2: 28 mmol/L (ref 21–32)
Chloride: 105 mmol/L (ref 98–107)
Creatinine: 0.75 mg/dL (ref 0.60–1.30)
GLUCOSE: 93 mg/dL (ref 65–99)
OSMOLALITY: 279 (ref 275–301)
Potassium: 3 mmol/L — ABNORMAL LOW (ref 3.5–5.1)
SGOT(AST): 26 U/L (ref 15–37)
Sodium: 141 mmol/L (ref 136–145)
Total Protein: 6.6 g/dL (ref 6.4–8.2)

## 2014-02-08 LAB — CBC CANCER CENTER
Basophil #: 0.1 x10 3/mm (ref 0.0–0.1)
Basophil %: 0.6 %
EOS ABS: 0.2 x10 3/mm (ref 0.0–0.7)
EOS PCT: 1.9 %
HCT: 32 % — ABNORMAL LOW (ref 35.0–47.0)
HGB: 10.2 g/dL — ABNORMAL LOW (ref 12.0–16.0)
Lymphocyte #: 1.5 x10 3/mm (ref 1.0–3.6)
Lymphocyte %: 14.5 %
MCH: 28.3 pg (ref 26.0–34.0)
MCHC: 31.8 g/dL — ABNORMAL LOW (ref 32.0–36.0)
MCV: 89 fL (ref 80–100)
MONOS PCT: 6.8 %
Monocyte #: 0.7 x10 3/mm (ref 0.2–0.9)
Neutrophil #: 7.8 x10 3/mm — ABNORMAL HIGH (ref 1.4–6.5)
Neutrophil %: 76.2 %
PLATELETS: 209 x10 3/mm (ref 150–440)
RBC: 3.6 10*6/uL — ABNORMAL LOW (ref 3.80–5.20)
RDW: 17.5 % — AB (ref 11.5–14.5)
WBC: 10.2 x10 3/mm (ref 3.6–11.0)

## 2014-02-08 LAB — MAGNESIUM: Magnesium: 1.4 mg/dL — ABNORMAL LOW

## 2014-02-16 ENCOUNTER — Ambulatory Visit: Payer: Self-pay | Admitting: Oncology

## 2014-02-16 LAB — HM MAMMOGRAPHY: HM Mammogram: NORMAL

## 2014-02-17 ENCOUNTER — Encounter: Payer: Self-pay | Admitting: Family Medicine

## 2014-02-18 ENCOUNTER — Encounter: Payer: Self-pay | Admitting: *Deleted

## 2014-02-23 LAB — CBC CANCER CENTER
Basophil #: 0.1 x10 3/mm (ref 0.0–0.1)
Basophil %: 0.4 %
Eosinophil #: 0.1 x10 3/mm (ref 0.0–0.7)
Eosinophil %: 0.5 %
HCT: 32.3 % — ABNORMAL LOW (ref 35.0–47.0)
HGB: 10.1 g/dL — ABNORMAL LOW (ref 12.0–16.0)
Lymphocyte #: 1.7 x10 3/mm (ref 1.0–3.6)
Lymphocyte %: 10.8 %
MCH: 27.2 pg (ref 26.0–34.0)
MCHC: 31.2 g/dL — ABNORMAL LOW (ref 32.0–36.0)
MCV: 87 fL (ref 80–100)
MONO ABS: 0.7 x10 3/mm (ref 0.2–0.9)
Monocyte %: 4.5 %
NEUTROS ABS: 13.3 x10 3/mm — AB (ref 1.4–6.5)
Neutrophil %: 83.8 %
Platelet: 204 x10 3/mm (ref 150–440)
RBC: 3.7 10*6/uL — AB (ref 3.80–5.20)
RDW: 17.1 % — ABNORMAL HIGH (ref 11.5–14.5)
WBC: 15.9 x10 3/mm — ABNORMAL HIGH (ref 3.6–11.0)

## 2014-02-23 LAB — COMPREHENSIVE METABOLIC PANEL
AST: 25 U/L (ref 15–37)
Albumin: 3.3 g/dL — ABNORMAL LOW (ref 3.4–5.0)
Alkaline Phosphatase: 135 U/L — ABNORMAL HIGH
Anion Gap: 9 (ref 7–16)
BILIRUBIN TOTAL: 0.3 mg/dL (ref 0.2–1.0)
BUN: 8 mg/dL (ref 7–18)
Calcium, Total: 9.3 mg/dL (ref 8.5–10.1)
Chloride: 107 mmol/L (ref 98–107)
Co2: 30 mmol/L (ref 21–32)
Creatinine: 0.76 mg/dL (ref 0.60–1.30)
EGFR (African American): >60
GLUCOSE: 106 mg/dL — AB (ref 65–99)
Osmolality: 289 (ref 275–301)
Potassium: 3.3 mmol/L — ABNORMAL LOW (ref 3.5–5.1)
SGPT (ALT): 20 U/L
SODIUM: 146 mmol/L — AB (ref 136–145)
Total Protein: 6.5 g/dL (ref 6.4–8.2)

## 2014-02-24 LAB — CEA: CEA: 2.9 ng/mL (ref 0.0–4.7)

## 2014-03-11 LAB — CBC CANCER CENTER
BASOS ABS: 0.1 x10 3/mm (ref 0.0–0.1)
Basophil %: 0.5 %
EOS PCT: 2.2 %
Eosinophil #: 0.3 x10 3/mm (ref 0.0–0.7)
HCT: 32.5 % — AB (ref 35.0–47.0)
HGB: 10 g/dL — ABNORMAL LOW (ref 12.0–16.0)
Lymphocyte #: 1.6 x10 3/mm (ref 1.0–3.6)
Lymphocyte %: 12.4 %
MCH: 26.2 pg (ref 26.0–34.0)
MCHC: 30.6 g/dL — ABNORMAL LOW (ref 32.0–36.0)
MCV: 85 fL (ref 80–100)
Monocyte #: 0.8 x10 3/mm (ref 0.2–0.9)
Monocyte %: 6.4 %
Neutrophil #: 9.9 x10 3/mm — ABNORMAL HIGH (ref 1.4–6.5)
Neutrophil %: 78.5 %
Platelet: 261 x10 3/mm (ref 150–440)
RBC: 3.81 10*6/uL (ref 3.80–5.20)
RDW: 18.1 % — ABNORMAL HIGH (ref 11.5–14.5)
WBC: 12.6 x10 3/mm — AB (ref 3.6–11.0)

## 2014-03-11 LAB — COMPREHENSIVE METABOLIC PANEL
AST: 29 U/L (ref 15–37)
Albumin: 3.3 g/dL — ABNORMAL LOW (ref 3.4–5.0)
Alkaline Phosphatase: 138 U/L — ABNORMAL HIGH
Anion Gap: 6 — ABNORMAL LOW (ref 7–16)
BILIRUBIN TOTAL: 0.3 mg/dL (ref 0.2–1.0)
BUN: 8 mg/dL (ref 7–18)
CO2: 31 mmol/L (ref 21–32)
Calcium, Total: 9.4 mg/dL (ref 8.5–10.1)
Chloride: 104 mmol/L (ref 98–107)
Creatinine: 0.79 mg/dL (ref 0.60–1.30)
EGFR (African American): >60
GLUCOSE: 91 mg/dL (ref 65–99)
Osmolality: 279 (ref 275–301)
Potassium: 3.1 mmol/L — ABNORMAL LOW (ref 3.5–5.1)
SGPT (ALT): 22 U/L
Sodium: 141 mmol/L (ref 136–145)
Total Protein: 6.4 g/dL (ref 6.4–8.2)

## 2014-03-11 LAB — TSH: Thyroid Stimulating Horm: 3.01 u[IU]/mL

## 2014-03-12 LAB — CEA: CEA: 3.1 ng/mL (ref 0.0–4.7)

## 2014-03-18 ENCOUNTER — Ambulatory Visit: Payer: Self-pay | Admitting: Oncology

## 2014-03-22 ENCOUNTER — Ambulatory Visit (INDEPENDENT_AMBULATORY_CARE_PROVIDER_SITE_OTHER): Payer: Medicare Other

## 2014-03-22 DIAGNOSIS — Z23 Encounter for immunization: Secondary | ICD-10-CM

## 2014-03-31 ENCOUNTER — Ambulatory Visit: Payer: Self-pay | Admitting: Oncology

## 2014-03-31 LAB — COMPREHENSIVE METABOLIC PANEL
ALK PHOS: 101 U/L
ALT: 21 U/L
Albumin: 3.3 g/dL — ABNORMAL LOW (ref 3.4–5.0)
Anion Gap: 9 (ref 7–16)
BILIRUBIN TOTAL: 0.7 mg/dL (ref 0.2–1.0)
BUN: 12 mg/dL (ref 7–18)
CALCIUM: 9.9 mg/dL (ref 8.5–10.1)
CHLORIDE: 100 mmol/L (ref 98–107)
CO2: 28 mmol/L (ref 21–32)
Creatinine: 0.89 mg/dL (ref 0.60–1.30)
EGFR (African American): >60
EGFR (Non-African Amer.): >60
Glucose: 111 mg/dL — ABNORMAL HIGH (ref 65–99)
Osmolality: 274 (ref 275–301)
Potassium: 3.3 mmol/L — ABNORMAL LOW (ref 3.5–5.1)
SGOT(AST): 20 U/L (ref 15–37)
Sodium: 137 mmol/L (ref 136–145)
TOTAL PROTEIN: 6.9 g/dL (ref 6.4–8.2)

## 2014-03-31 LAB — CBC CANCER CENTER
BASOS ABS: 0 x10 3/mm (ref 0.0–0.1)
BASOS PCT: 0.3 %
EOS PCT: 0 %
Eosinophil #: 0 x10 3/mm (ref 0.0–0.7)
HCT: 32.6 % — ABNORMAL LOW (ref 35.0–47.0)
HGB: 10 g/dL — AB (ref 12.0–16.0)
LYMPHS ABS: 0.5 x10 3/mm — AB (ref 1.0–3.6)
Lymphocyte %: 4 %
MCH: 25 pg — AB (ref 26.0–34.0)
MCHC: 30.6 g/dL — ABNORMAL LOW (ref 32.0–36.0)
MCV: 82 fL (ref 80–100)
MONO ABS: 0.5 x10 3/mm (ref 0.2–0.9)
Monocyte %: 4.2 %
NEUTROS ABS: 11.6 x10 3/mm — AB (ref 1.4–6.5)
NEUTROS PCT: 91.5 %
Platelet: 247 x10 3/mm (ref 150–440)
RBC: 3.99 10*6/uL (ref 3.80–5.20)
RDW: 17.8 % — AB (ref 11.5–14.5)
WBC: 12.7 x10 3/mm — ABNORMAL HIGH (ref 3.6–11.0)

## 2014-04-01 LAB — CBC CANCER CENTER
BASOS PCT: 0.6 %
Basophil #: 0 x10 3/mm (ref 0.0–0.1)
EOS ABS: 0 x10 3/mm (ref 0.0–0.7)
Eosinophil %: 0.5 %
HCT: 29.7 % — ABNORMAL LOW (ref 35.0–47.0)
HGB: 9.1 g/dL — ABNORMAL LOW (ref 12.0–16.0)
LYMPHS PCT: 11.9 %
Lymphocyte #: 0.9 x10 3/mm — ABNORMAL LOW (ref 1.0–3.6)
MCH: 24.9 pg — AB (ref 26.0–34.0)
MCHC: 30.8 g/dL — AB (ref 32.0–36.0)
MCV: 81 fL (ref 80–100)
Monocyte #: 0.5 x10 3/mm (ref 0.2–0.9)
Monocyte %: 7 %
Neutrophil #: 6.1 x10 3/mm (ref 1.4–6.5)
Neutrophil %: 80 %
Platelet: 249 x10 3/mm (ref 150–440)
RBC: 3.66 10*6/uL — ABNORMAL LOW (ref 3.80–5.20)
RDW: 17.3 % — ABNORMAL HIGH (ref 11.5–14.5)
WBC: 7.6 x10 3/mm (ref 3.6–11.0)

## 2014-04-01 LAB — COMPREHENSIVE METABOLIC PANEL
AST: 17 U/L (ref 15–37)
Albumin: 3 g/dL — ABNORMAL LOW (ref 3.4–5.0)
Alkaline Phosphatase: 88 U/L
Anion Gap: 6 — ABNORMAL LOW (ref 7–16)
BUN: 12 mg/dL (ref 7–18)
Bilirubin,Total: 0.5 mg/dL (ref 0.2–1.0)
Calcium, Total: 10 mg/dL (ref 8.5–10.1)
Chloride: 104 mmol/L (ref 98–107)
Co2: 30 mmol/L (ref 21–32)
Creatinine: 0.71 mg/dL (ref 0.60–1.30)
EGFR (African American): >60
EGFR (Non-African Amer.): >60
Glucose: 101 mg/dL — ABNORMAL HIGH (ref 65–99)
Osmolality: 279 (ref 275–301)
Potassium: 3.3 mmol/L — ABNORMAL LOW (ref 3.5–5.1)
SGPT (ALT): 18 U/L
Sodium: 140 mmol/L (ref 136–145)
Total Protein: 6.5 g/dL (ref 6.4–8.2)

## 2014-04-06 LAB — COMPREHENSIVE METABOLIC PANEL
AST: 18 U/L (ref 15–37)
Albumin: 2.9 g/dL — ABNORMAL LOW (ref 3.4–5.0)
Alkaline Phosphatase: 72 U/L
Anion Gap: 7 (ref 7–16)
BUN: 13 mg/dL (ref 7–18)
Bilirubin,Total: 0.3 mg/dL (ref 0.2–1.0)
CALCIUM: 9.5 mg/dL (ref 8.5–10.1)
CO2: 30 mmol/L (ref 21–32)
Chloride: 104 mmol/L (ref 98–107)
Creatinine: 0.65 mg/dL (ref 0.60–1.30)
EGFR (African American): >60
GLUCOSE: 113 mg/dL — AB (ref 65–99)
OSMOLALITY: 282 (ref 275–301)
Potassium: 3.2 mmol/L — ABNORMAL LOW (ref 3.5–5.1)
SGPT (ALT): 16 U/L
Sodium: 141 mmol/L (ref 136–145)
TOTAL PROTEIN: 6.2 g/dL — AB (ref 6.4–8.2)

## 2014-04-06 LAB — CBC WITH DIFFERENTIAL/PLATELET
BASOS PCT: 0.8 %
Basophil #: 0.1 10*3/uL (ref 0.0–0.1)
EOS ABS: 0 10*3/uL (ref 0.0–0.7)
Eosinophil %: 0.3 %
HCT: 28.1 % — ABNORMAL LOW (ref 35.0–47.0)
HGB: 8.4 g/dL — ABNORMAL LOW (ref 12.0–16.0)
Lymphocyte #: 0.9 10*3/uL — ABNORMAL LOW (ref 1.0–3.6)
Lymphocyte %: 13 %
MCH: 24 pg — AB (ref 26.0–34.0)
MCHC: 29.8 g/dL — AB (ref 32.0–36.0)
MCV: 81 fL (ref 80–100)
MONO ABS: 0.7 x10 3/mm (ref 0.2–0.9)
MONOS PCT: 10.2 %
NEUTROS ABS: 5.3 10*3/uL (ref 1.4–6.5)
NEUTROS PCT: 75.7 %
Platelet: 286 10*3/uL (ref 150–440)
RBC: 3.49 10*6/uL — AB (ref 3.80–5.20)
RDW: 17.7 % — ABNORMAL HIGH (ref 11.5–14.5)
WBC: 7 10*3/uL (ref 3.6–11.0)

## 2014-04-06 LAB — URINALYSIS, COMPLETE
Bacteria: NONE SEEN
Bilirubin,UR: NEGATIVE
Blood: NEGATIVE
GLUCOSE, UR: NEGATIVE mg/dL (ref 0–75)
Hyaline Cast: 5
Ketone: NEGATIVE
NITRITE: NEGATIVE
Ph: 6 (ref 4.5–8.0)
Protein: 30
RBC,UR: 2 /HPF (ref 0–5)
Specific Gravity: 1.019 (ref 1.003–1.030)
Squamous Epithelial: <1
WBC UR: 9 /HPF (ref 0–5)

## 2014-04-06 LAB — TROPONIN I: Troponin-I: <0.02 ng/mL

## 2014-04-06 LAB — MAGNESIUM: MAGNESIUM: 1.5 mg/dL — AB

## 2014-04-06 LAB — LIPASE, BLOOD: Lipase: 409 U/L — ABNORMAL HIGH (ref 73–393)

## 2014-04-06 LAB — PROTIME-INR
INR: 1
Prothrombin Time: 12.9 secs (ref 11.5–14.7)

## 2014-04-07 ENCOUNTER — Inpatient Hospital Stay: Payer: Self-pay | Admitting: Internal Medicine

## 2014-04-08 DIAGNOSIS — K565 Intestinal adhesions [bands] with obstruction (postprocedural) (postinfection): Secondary | ICD-10-CM

## 2014-04-08 DIAGNOSIS — C786 Secondary malignant neoplasm of retroperitoneum and peritoneum: Secondary | ICD-10-CM

## 2014-04-08 LAB — COMPREHENSIVE METABOLIC PANEL
ALK PHOS: 397 U/L — AB
ALT: 225 U/L — AB
Albumin: 2.9 g/dL — ABNORMAL LOW (ref 3.4–5.0)
Anion Gap: 9 (ref 7–16)
BILIRUBIN TOTAL: 2.1 mg/dL — AB (ref 0.2–1.0)
BUN: 16 mg/dL (ref 7–18)
Calcium, Total: 9 mg/dL (ref 8.5–10.1)
Chloride: 107 mmol/L (ref 98–107)
Co2: 26 mmol/L (ref 21–32)
Creatinine: 0.96 mg/dL (ref 0.60–1.30)
EGFR (Non-African Amer.): >60
GLUCOSE: 97 mg/dL (ref 65–99)
OSMOLALITY: 284 (ref 275–301)
POTASSIUM: 4 mmol/L (ref 3.5–5.1)
SGOT(AST): 233 U/L — ABNORMAL HIGH (ref 15–37)
Sodium: 142 mmol/L (ref 136–145)
Total Protein: 5.9 g/dL — ABNORMAL LOW (ref 6.4–8.2)

## 2014-04-08 LAB — CBC WITH DIFFERENTIAL/PLATELET
BASOS ABS: 0 10*3/uL (ref 0.0–0.1)
Basophil %: 0.2 %
Eosinophil #: 0 10*3/uL (ref 0.0–0.7)
Eosinophil %: 0 %
HCT: 30.7 % — ABNORMAL LOW (ref 35.0–47.0)
HGB: 9.6 g/dL — ABNORMAL LOW (ref 12.0–16.0)
LYMPHS PCT: 2.4 %
Lymphocyte #: 0.5 10*3/uL — ABNORMAL LOW (ref 1.0–3.6)
MCH: 24.5 pg — ABNORMAL LOW (ref 26.0–34.0)
MCHC: 31.1 g/dL — ABNORMAL LOW (ref 32.0–36.0)
MCV: 79 fL — ABNORMAL LOW (ref 80–100)
Monocyte #: 0.7 x10 3/mm (ref 0.2–0.9)
Monocyte %: 3.3 %
NEUTROS ABS: 20.2 10*3/uL — AB (ref 1.4–6.5)
Neutrophil %: 94.1 %
Platelet: 357 10*3/uL (ref 150–440)
RBC: 3.89 10*6/uL (ref 3.80–5.20)
RDW: 18 % — AB (ref 11.5–14.5)
WBC: 21.4 10*3/uL — ABNORMAL HIGH (ref 3.6–11.0)

## 2014-04-08 LAB — MAGNESIUM: MAGNESIUM: 1.7 mg/dL — AB

## 2014-04-09 ENCOUNTER — Telehealth: Payer: Self-pay | Admitting: Family Medicine

## 2014-04-14 ENCOUNTER — Telehealth: Payer: Self-pay | Admitting: Family Medicine

## 2014-04-14 NOTE — Telephone Encounter (Signed)
Called pt 's cell and daughter unable to reach either.

## 2014-04-14 NOTE — Telephone Encounter (Signed)
Pt daughter returned your call.  [phone]

## 2014-04-15 ENCOUNTER — Encounter: Payer: Self-pay | Admitting: Family Medicine

## 2014-04-15 NOTE — Telephone Encounter (Signed)
Spoke with patient's daughter - expressed my condolences

## 2014-04-18 ENCOUNTER — Ambulatory Visit: Payer: Self-pay | Admitting: Oncology

## 2014-04-18 NOTE — Telephone Encounter (Signed)
Hospice called this morning to inform you that Whitney Meza passed away this morning at 6:47 am.

## 2014-04-18 DEATH — deceased

## 2014-08-16 IMAGING — CT CT ABD-PELV W/ CM
2 of 5 series · 15 of 46 positions shown, 17 images · IV contrast (isovue)
Comparison: 11/18/2013

CLINICAL DATA: Abdominal pain, vomiting, leukocytosis.

EXAM:
CT ABDOMEN AND PELVIS WITH CONTRAST
TECHNIQUE: Multidetector CT imaging of the abdomen and pelvis was performed
using the standard protocol following bolus administration of
intravenous contrast.
CONTRAST:  The 85 cc Isovue 300 intravenous

[Series 2: routine abd pel with · axial · 0.64mm/px · z∈[-904,-558]mm · 12 of 79 slices shown, 14 images]
[im 5/79  soft-tissue]
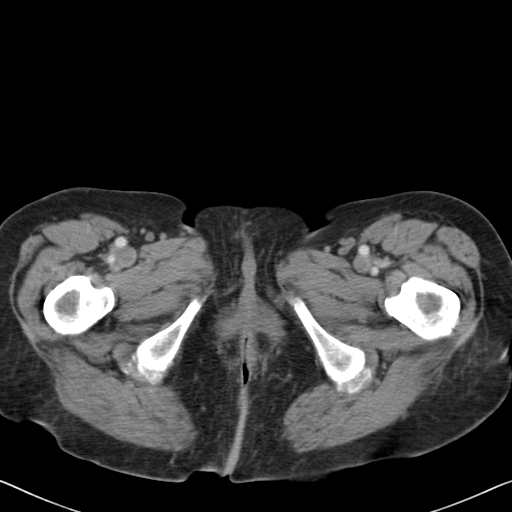
[im 5/79  bone]
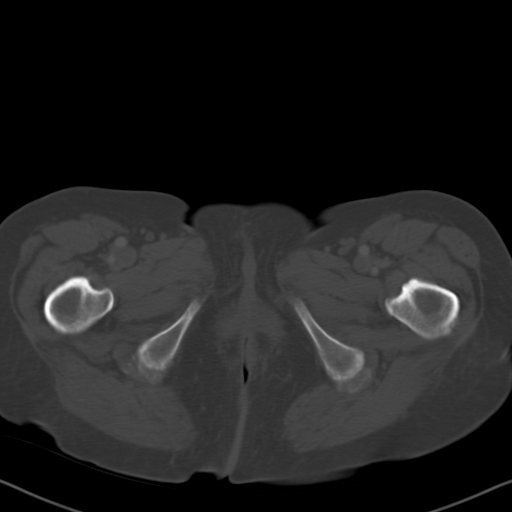
[im 13/79  soft-tissue]
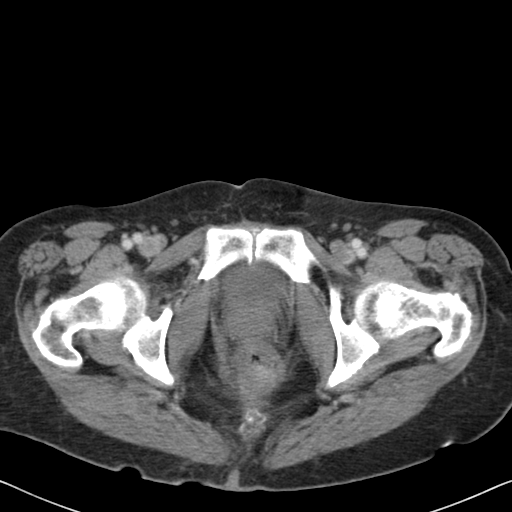
[im 17/79  soft-tissue]
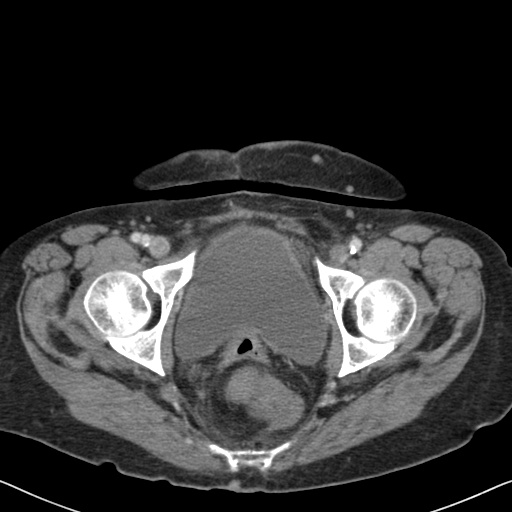
[im 25/79  soft-tissue]
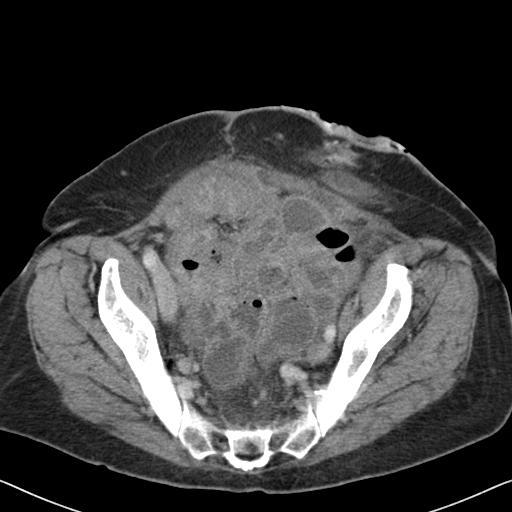
[im 29/79  soft-tissue]
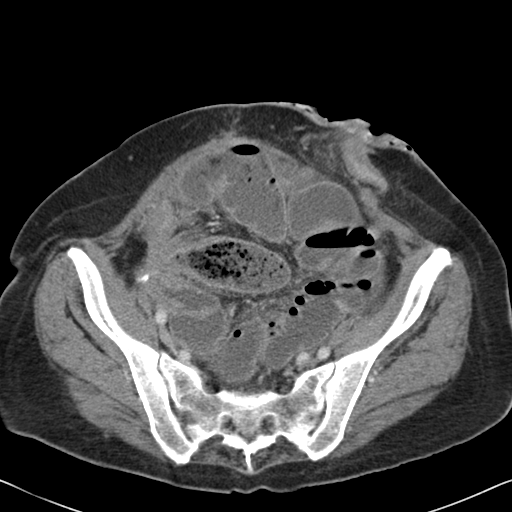
[im 37/79  soft-tissue]
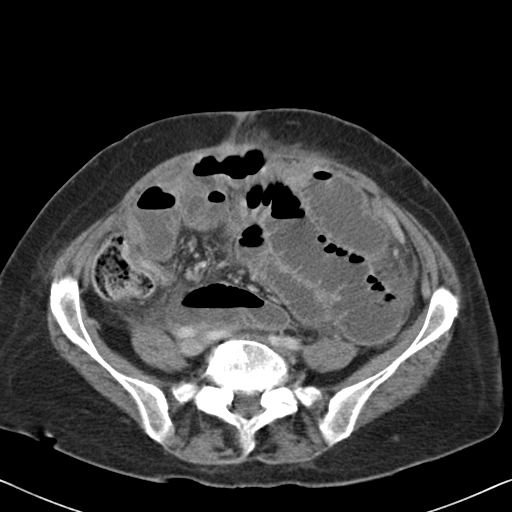
[im 42/79  soft-tissue]
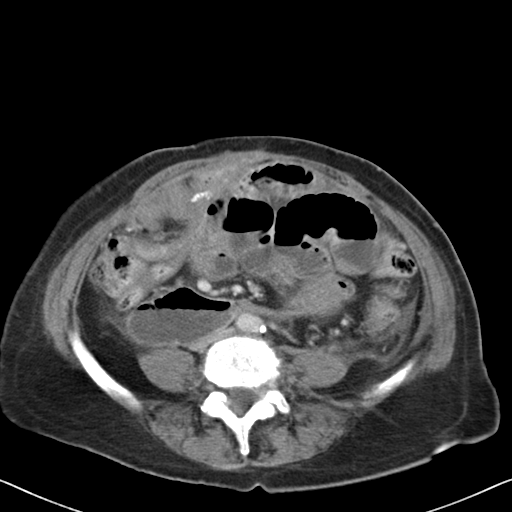
[im 50/79  soft-tissue]
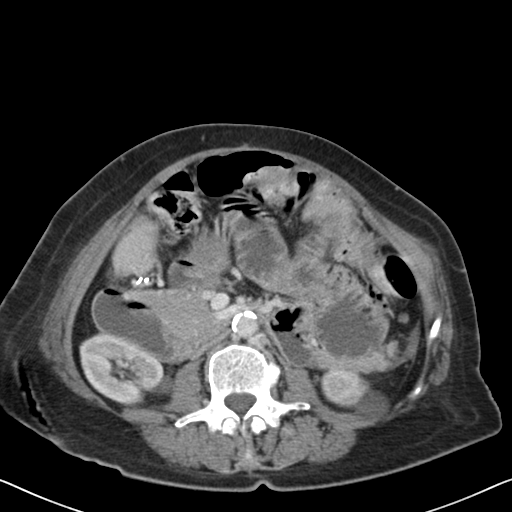
[im 54/79  soft-tissue]
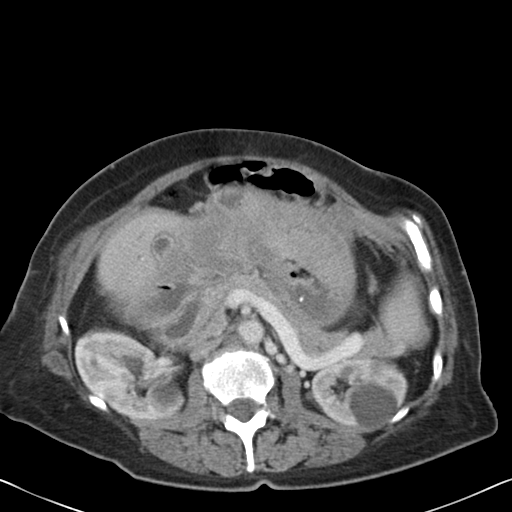
[im 54/79  bone]
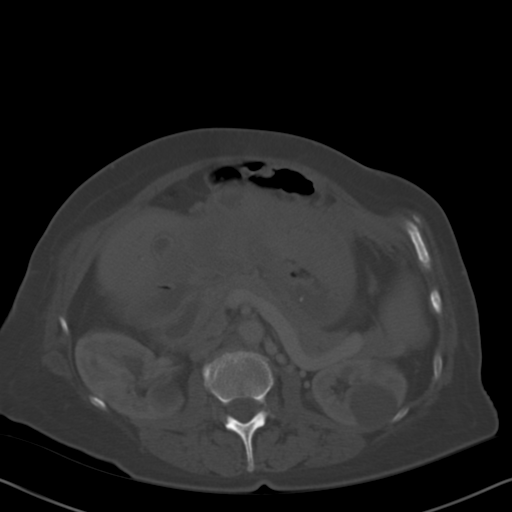
[im 62/79  soft-tissue]
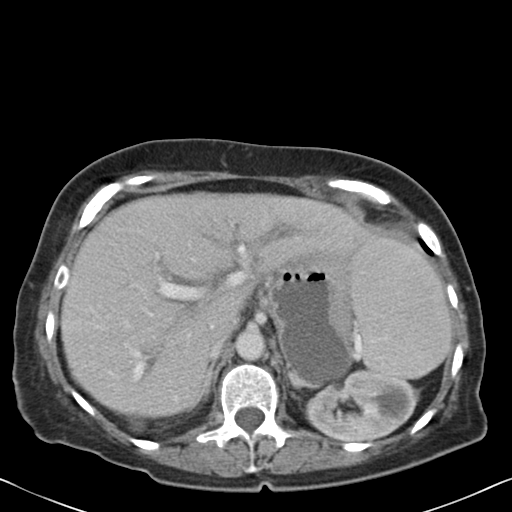
[im 66/79  soft-tissue]
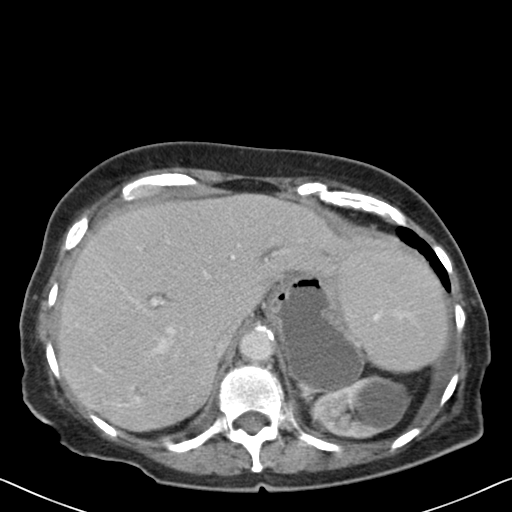
[im 74/79  soft-tissue]
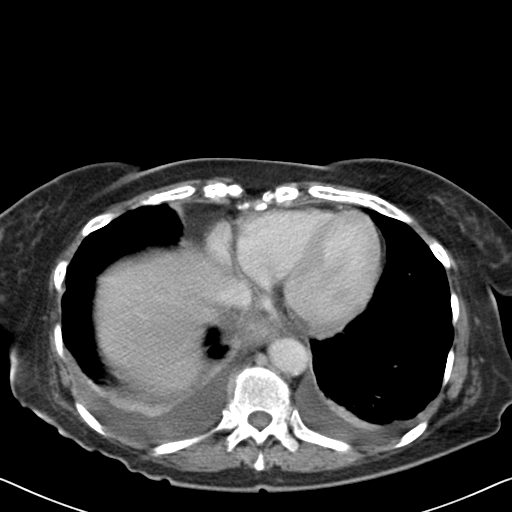

[Series 6: cor routine abd pel with · coronal · 0.68mm/px · 3 of 114 slices shown]
[im 38/114  soft-tissue]
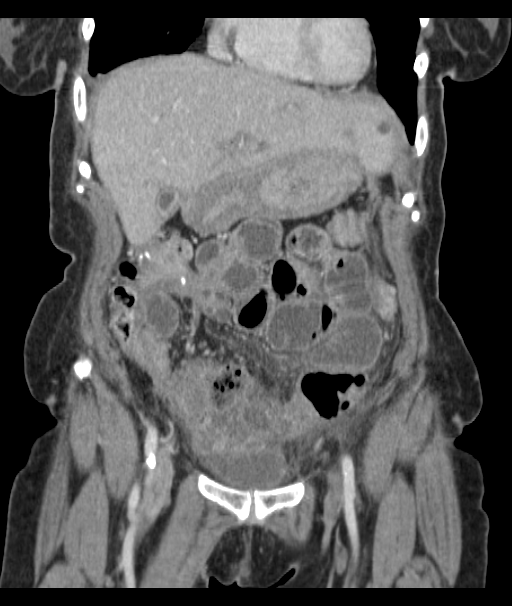
[im 51/114  soft-tissue]
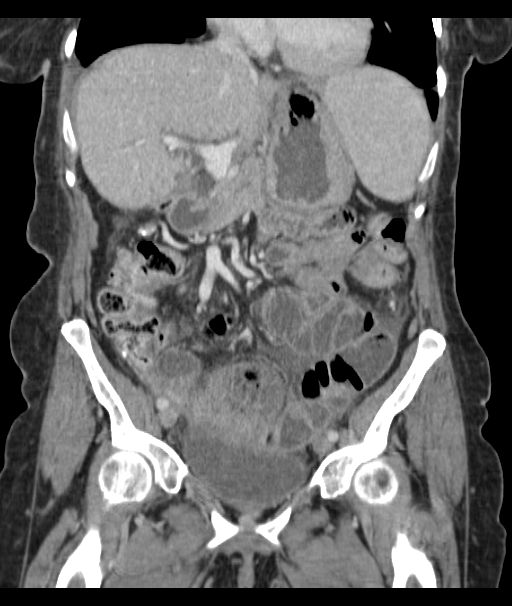
[im 63/114  soft-tissue]
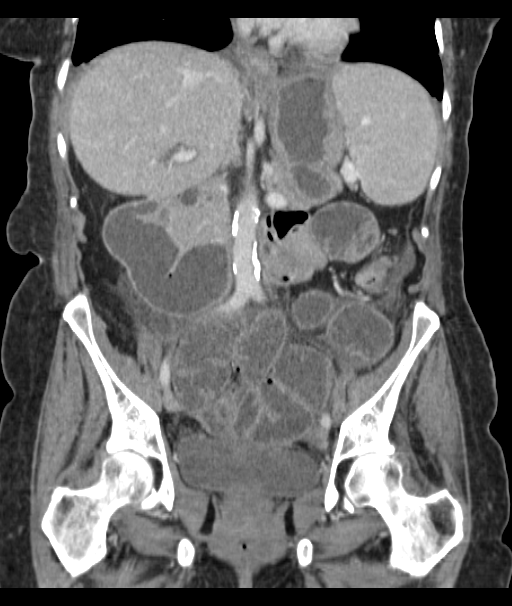

[15 of 46 positions shown; findings below may reference images not displayed]

FINDINGS: BODY WALL: Midline anterior lower chest subcutaneous nodule which
appears smooth and dermal based, suggesting inclusion cyst.

LOWER CHEST: Small, layering bilateral pleural effusions.

ABDOMEN/PELVIS:

Liver: Chronic subcapsular low-density collection along the anterior
left lobe. The collection continues to measure approximately 3 cm in
length by 1 cm in depth. This could represent a remote posttraumatic
or postsurgical hematoma. There is a stable sub cm low-density near
the gallbladder fossa. No definitive liver metastasis.

Biliary: Decompressed gallbladder. No calcified stone. There is
intra and extrahepatic biliary ductal enlargement which has mildly
increased from prior. No causative abnormality seen.

Pancreas: Unremarkable.

Spleen: Well circumscribed low-density in the anterior spleen, 12 mm
in diameter.

Adrenals: Unremarkable.

Kidneys and ureters: Bilateral renal cysts. No evidence of solid
mass. No hydronephrosis or nephrolithiasis.

Bladder: Unremarkable.

Reproductive: The uterus is either resected or atrophic. The ovaries
are not clearly resolved.

Bowel: There is a Hartman's pouch. Previously seen rectal thickening
has decreased. A descending colostomy persists. There is a
enterocolonic anastomosis in the right upper quadrant compatible
with right hemicolectomy. There is dilated small bowel throughout
the abdomen, with fluid levels. There are multiple potential sites
for transition point, but the most significant is in the low central
pelvis were there is a loop of fecalized small bowel which rapidly
tapers and region of extensive bowel angulation/distortion. This
would be at the level of the ileum. There is no measurable tumor in
this region, although serosal disease is possible. There are no
devascularized segments of bowel identified. No pneumoperitoneum to
suggest perforation. Marked thickening of the mucosa and submucosa
of the gastric body and antrum. No infiltration of the perigastric
fat.

Peritoneum: Small volume ascites, best seen on the the left
pericolic gutter. There is no definitive peritoneal nodule.

Vascular: No acute abnormality.

OSSEOUS: No acute abnormalities.
IMPRESSION: 1. Small bowel obstruction with transition point in the low pelvis.
There is extensive bowel tethering, suggesting adhesive disease.
Cannot exclude non measurable peritoneal disease.
2. Right hemicolectomy for colon carcinoma.
3. Marked thickening of the stomach wall, a possible second
carcinoma.
4. Small bilateral pleural effusions.
5. Enlarging biliary tree without identifiable cause.

## 2014-10-09 NOTE — Consult Note (Signed)
Brief Consult Note: Diagnosis: ileus, GI bleed.   Patient was seen by consultant.   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  CT shows ileus & after 2 days without colostomy output, she has now had a large amount of non-bloody output.   She did have moderate amt of red blood from ostomy but this has resolved.  Differentials include bleeding from tumor burden, AVM, or ischemia.  Since hgb stable & bleeding resolved, would hold off on endoscopic evaluation at this time unless she has ongoing bleeding requiring intervention.    Plan: 1) Advance to small amts of clear liquids as tolerated 2) monitor h/h & ostomy output 3) continue supportive measures Thanks for allowing [place] to participate in her care.  Please see full dictated note. #.  Electronic Signatures: Andria Meuse (NP)  (Signed 21-May-15 10:07)  Authored: Brief Consult Note   Last Updated: 21-May-15 10:07 by Andria Meuse (NP)

## 2014-10-09 NOTE — Consult Note (Signed)
ONCOLOGY followup note - states that she is doing better, abdominal pain has improved. Tolerating liquid diet without nausea or vomiting.continues to require nasal cannula oxygen due to persistent hypoxia on ambulation, also feels SOB on exertion.sitting in bed, alert and oriented, NAD.          Vitals - 98.3, 78, 18, 107/62, 93% on 2L O2          HEENT - no oral thrush or petechiae          Lungs - bilaterally good air entry, no rhonchi          Abdomen - soft, nontender.  hemoglobin 9.3, WBC 9300, ANC 7900, platelets 140K.  1. Carcinoma of Colon stage IV disease s/p recent FOLFIRI chemotherapy, admitted in the hospital with abdominal pain.  CT scan shows possibility of stricture obstruction. Surgeon Dr. Jamal Collin is following. Clinically symptoms better, diet being advanced to full liquids. Also felt to have colitis, clinically improving on current antibiotic with Ciprofloxacin/Flagyl. Hypoxia, DOE - will get CT scan of the chest to rule out pulmonary embolism. If no PE, will consider Lasix and bronchodilator.  clinically doing well and tolerating full liquid diet, may discharge tomorrow and she was advised to keep outpatient appointment at cancer center same as previously scheduled.  - CT chest IMPRESSION: No evidence of pulmonary embolism. Multifocal patchy opacities, suggesting multifocal pneumonia, possibly on the basis of aspiration. Small bilateral pleural effusions, increased, with associated lower lobe atelectasis. Fluid-filled esophagus, suggesting esophageal reflux dysmotility orreflux.give aldactone (has sulfa allergy)lasix + bronchodilator, repeat CXR in am.    Electronic Signatures: Jonn Shingles (MD) (Signed on 03-Jul-15 17:24)  Authored   Last Updated: 03-Jul-15 17:31 by Jonn Shingles (MD)

## 2014-10-09 NOTE — Consult Note (Signed)
Chief Complaint:  Subjective/Chief Complaint Pt denies any abdominal pain.  Denies nausea or vomiting.  Tolerating liquids well.  SHe has had normal ostomy output without blood.   VITAL SIGNS/ANCILLARY NOTES: **Vital Signs.:   22-May-15 08:10  Vital Signs Type Routine  Temperature Temperature (F) 98  Celsius 36.6  Temperature Source oral  Pulse Pulse 76  Respirations Respirations 20  Systolic BP Systolic BP 846  Diastolic BP (mmHg) Diastolic BP (mmHg) 73  Mean BP 93  Pulse Ox % Pulse Ox % 86  Pulse Ox Activity Level  At rest  Oxygen Delivery Room Air/ 21 %   Brief Assessment:  GEN well developed, well nourished, no acute distress, A/Ox3   Cardiac Regular   Respiratory normal resp effort   Gastrointestinal details normal Soft  Nondistended  Bowel sounds normal  No rebound tenderness  +medium brown stool in bag, beefy red stomach, no gross blood   EXTR negative cyanosis/clubbing, negative edema   Additional Physical Exam Skin: pink, warm , dry   Lab Results: Hepatic:  22-May-15 05:23   Bilirubin, Total 0.4  Alkaline Phosphatase  158 (45-117 NOTE: New Reference Range 05/08/13)  SGPT (ALT) 54  SGOT (AST)  54  Total Protein, Serum  5.3  Albumin, Serum  2.3  Routine Chem:  22-May-15 05:23   Result Comment LABS - This specimen was collected through an   - indwelling catheter or arterial line.  - A minimum of 20mls of blood was wasted prior    - to collecting the sample.  Interpret  - results with caution.  Result(s) reported on 06 Nov 2013 at 05:57AM.  Glucose, Serum 94  BUN  6  Creatinine (comp)  0.55  Potassium, Serum  3.3  Chloride, Serum  109  CO2, Serum 27  Calcium (Total), Serum  8.1  Osmolality (calc) 282  eGFR (African American) >60  eGFR (Non-African American) >60 (eGFR values <108mL/min/1.73 m2 may be an indication of chronic kidney disease (CKD). Calculated eGFR is useful in patients with stable renal function. The eGFR calculation will not be  reliable in acutely ill patients when serum creatinine is changing rapidly. It is not useful in  patients on dialysis. The eGFR calculation may not be applicable to patients at the low and high extremes of body sizes, pregnant women, and vegetarians.)  Anion Gap 7  Routine Hem:  22-May-15 05:23   WBC (CBC) 5.8  RBC (CBC)  3.47  Hemoglobin (CBC)  10.4  Hematocrit (CBC)  31.3  Platelet Count (CBC) 154  MCV 90  MCH 30.0  MCHC 33.3  RDW  16.1  Neutrophil % 68.6  Lymphocyte % 20.7  Monocyte % 8.1  Eosinophil % 2.0  Basophil % 0.6  Neutrophil # 4.0  Lymphocyte # 1.2  Monocyte # 0.5  Eosinophil # 0.1  Basophil # 0.0   Radiology Results: XRay:    20-May-15 18:18, Abdomen 3 Way Includes PA Chest  Abdomen 3 Way Includes PA Chest   REASON FOR EXAM:    abdominal distention, Call Dr. Oliva Bustard with report  COMMENTS:       PROCEDURE: DXR - DXR ABDOMEN 3-WAY (INCL PA CXR)  - Nov 04 2013  6:18PM     CLINICAL DATA:  History of colon cancer.  Abdomen pain    EXAM:  ABDOMEN SERIES    COMPARISON:  None.    FINDINGS:  There is no focal infiltrate. There are small bilateral pleural  effusions. Right central venous line is identified with distal  tip  in the superior vena cava right atrial junction. The aorta is  tortuous. Heart size normal.    There is no free air. There are air-filled prominent small bowel  loops in the abdomen. Postsurgical changes with surgical sutures and  clips are identified in the right abdomen. Air is visualized in a  small segment of right colon.     IMPRESSION:  Ileus versus early small bowel obstruction. Followup is recommended.    Small bilateral pleural effusions.    These results will be called to the ordering clinician or  representative by the Radiologist Assistant, and communication  documented inthe PACS or zVision Dashboard.      Electronically Signed    By: Abelardo Diesel M.D.    On: 11/04/2013 18:30         Verified By: Abelardo Diesel,  M.D.,    21-May-15 14:25, Abdomen 3 Way Includes PA Chest  Abdomen 3 Way Includes PA Chest   REASON FOR EXAM:    PARALYTIC ILEUS  FOLLOW UP . CALL dR [facility] REPORT  COMMENTS:       PROCEDURE: DXR - DXR ABDOMEN 3-WAY (INCL PA CXR)  - Nov 05 2013  2:25PM     CLINICAL DATA:  Paralytic ileus followup.    EXAM:  ABDOMEN SERIES    COMPARISON:  Acute abdominal series 520 2015.    FINDINGS:  Right IJ power Port-A-Cath is in stable in satisfactory position.  Heart and mediastinal contours are stable and normal. There are  small bilateral pleural effusions and bibasilar atelectasis, without  significant change. Remainder the lung fields are clear.    Surgical suture is seen in the right abdomen. Probable surgical  suture noted in the pelvis. Ostomy projects over left lower  quadrant. There are multiple mildly dilated small bowel loops with  air-fluid levels seen within them on the upright view. Stomach is  nondilated. No significant colonic gas is seen. Overall, small bowel  dilatation appears similar to 11/04/2013. There are degenerative  changes of the lower lumbar spine.     IMPRESSION:  Persistent and similar dilatation of small bowel loops with  associated air-fluid levels. Findings are suspicious for small bowel  obstruction. Small-bowel obstruction is favored over ileus, given  the presence of the air-fluid levels and the lack of colonic  dilatation.    Postoperative changes with bowel suture noted in the right abdomen  and possibly in the pelvis.    Persistent small bilateral pleural effusions.      Electronically Signed    By: Curlene Dolphin M.D.    On: 11/05/2013 16:30         Verified By: Sheppard Evens, M.D.,   Assessment/Plan:  Assessment/Plan:  Assessment Ileus: Clinically improved with good ostomy output, bowel sounds & without nausea, pain or vomiting.  Tolerating clears well. 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    Ostomy bleeding: Resolved.  Hgb stable.  No indication for endoscopic intervention at this time.   Plan 1) May advance to soft food soon if tolerates clears this morning 2) Monitor for further bleeding or decreased ostomy output Dr Rayann Heman to cover over the weekend. Please call if you have any questions or concerns   Electronic Signatures: Andria Meuse (NP)  (Signed 22-May-15 10:56)  Authored: Chief Complaint, VITAL SIGNS/ANCILLARY NOTES, Brief Assessment, Lab Results, Radiology Results, Assessment/Plan   Last Updated: 22-May-15 10:56 by Andria Meuse (NP)

## 2014-10-09 NOTE — Discharge Summary (Signed)
PATIENT NAME:  Whitney Meza, Whitney Meza MR#:  [id number] DATE OF BIRTH:  May 18, 1941  DATE OF ADMISSION:  12/15/2013 DATE OF DISCHARGE:  12/21/2013  CONSULTANTS:  1. Dr. Oliva Bustard and Dr. Ma Hillock from [place]  2. Dr. Jamal Collin from surgery.  3. Speech therapy.   PRIMARY CARE PHYSICIAN: Dr. Danise Mina.   CHIEF COMPLAINT: Abdominal pain.   DISCHARGE DIAGNOSES: 1. Small bowel obstruction, resolved, likely mechanical due to previous adhesions.  2. Acute respiratory failure due to aspiration pneumonia/pneumonitis.  3. Mild dehydration.  4. Hyperkalemia.  5. Abdominal pain due to obstruction.  6. Stage IV colon cancer status post colostomy.  7. History of hypertension.  8. Hyperlipidemia.  9. Gastroesophageal reflux disease.  DISCHARGE MEDICATIONS: Carafate 1 gram 4 times a day, spironolactone 25 mg once a day, amlodipine 2.5 mg once a day, potassium chloride 10 mEq 1 cap 2 times a day, vitamin B12 at 1000 mcg daily, metoclopramide 10 mg 3 times a day, prednisone 10 mg once a day, fentanyl patch 12 mcg 1 patch every 72 hours, multivitamin 1 tab daily, pantoprazole 40 mg 2 times a day, albuterol 2 puffs 4 times a day as needed for shortness of breath, Lactobacillus 1 cap 2 times a day for 30 days, clindamycin 300 mg 1 cap every 8 hours for 5 days.   DIET: Low sodium, supplement Ensure, consistency full liquid diet.   ACTIVITY: As tolerated.   FOLLOWUP: Please follow at [facility] per Dr. Oliva Bustard within 1 to 2 weeks. Please follow with PCP within 1 to 2 weeks. Get referred to undergo pulmonary function tests to evaluate for possible underlying COPD.   The patient is FULL CODE.   SIGNIFICANT LABS AND IMAGING:  1. Initial CT abdomen and pelvis with contrast showed small bowel obstruction with transition point in the low pelvis. suggestion of  adhesive disease. A right hemicolectomy for colon cancer, marked thickening of the stomach wall possibly secondary to carcinoma, small bilateral pleural effusions,  enlarging biliary tree without identifiable cause.  2. Initial abdominal complete x-ray 3 or more views showing bowel gas pattern consistent with a partial distal small bowel obstruction or mild ileus. No evidence of perforation.  3. CT angiogram of chest asked to evaluate for PE: Negative PE but multifocal patchy opacities suggesting a multifocal pneumonia, possibly on the basis of aspiration, small bilateral effusions and fluid-filled esophagus suggesting esophageal reflux, dysmotility, or GERD. 4. X-ray of the chest, one view, today on the day of discharge, showing persistent bibasilar infiltrates, underlying emphysema, small pleural effusions bilaterally.   HISTORY OF PRESENT ILLNESS AND HOSPITAL COURSE: For full details of H and P, please see the dictation on June 30th by Dr. Lunette Stands, but briefly, this is a pleasant 74 year old with stage IV colon cancer status post hemicolectomy, who has a colostomy with history of undergoing chemotherapy currently, comes in with abdominal pain 10 out of 10 in consistency. She was admitted to the hospitalist service after failing outpatient treatment with Dilaudid. The patient was noted to have elevated white count, left shift and imaging suggested a small bowel obstruction possibly due to adhesions. She was admitted to the hospitalist service for pain control, IV fluids and antiemetics; initially kept n.p.o. The patient had not wanted an NG tube at that time. Oncology was consulted and the patient was seen by Dr. Oliva Bustard as well.   With regards to the small bowel obstruction, through conservative treatment that has resolved. The patient is passing gas and stool in the ostomy. The patient  was seen by Dr. Jamal Collin from surgery. We have requested previous labs from her previous abdominal surgeries. Per surgery, she is not a good a candidate if she has recurrent symptoms. Recommendation was to keep her on a full liquid diet at this time. Per surgery, she likely has multiple  areas of adhesions and given the prolonged surgery in the past, doing a repeat laparotomy if this is recurrent, is "treacherous." At this point, she is tolerating her liquid diet with Ensure.   In regards to the colon cancer, she will follow with Dr. Oliva Bustard as an outpatient.   She had mild acute dehydration and hypokalemia, and she was on fluids for that. She did spike a fever and she was pancultured including a CT angiogram to evaluate for PE as she also dropped her oxygen saturations. She did have acute respiratory failure, which appears to be resolved at this point with saturations above 90% on ambulation. That it is deemed to be secondary to pneumonia, likely aspiration/aspiration pneumonitis. She was seen by speech therapy, who believed that there is no oropharyngeal issue, but this is likely of from severe GERD. It is possible that she, through her nausea and vomiting episodes prior to admission and small bowel obstruction, aspirated stomach contents but it is more likely that this is from bad reflux. She will be discharged on Protonix twice daily, Carafate and Reglan. At this point, she will be discharged on 5 more days of clindamycin. She will also be discharged on probiotic and was instructed to eat plenty of yogurt in the interim.   PHYSICAL EXAMINATION: VITAL SIGNS: On the day of discharge: Temperature is 97.6, pulse rate 75 respiratory rate 20, blood pressure 128/72, oxygen saturation 91% with exertion, oxygen 96% at rest.  GENERAL: The patient is a pleasant, well-developed female lying in bed.  HEENT: Normocephalic, atraumatic.  HEART: Normal S1, S2.  LUNGS: Good air entry in the upper lobes, just mild crackles but otherwise no significant wheezing or rhonchi.  ABDOMEN: Has ostomy. No significant tenderness. No edema.   CODE STATUS: The patient is a FULL CODE.   TOTAL TIME SPENT: About 40 minutes on this case today.      ____________________________ Vivien Presto,  MD sa:lm D: 12/21/2013 13:15:11 ET T: 12/22/2013 00:34:11 ET JOB#: [id number]  cc: Vivien Presto, MD, <Dictator> Dr. Ignacia Felling K. Oliva Bustard, MD Vivien Presto MD ELECTRONICALLY SIGNED 01/14/2014 14:56

## 2014-10-09 NOTE — Consult Note (Signed)
ONCOLOGY followup note - continues to require [place] O2, gets hypoxic off of it. Tolerating full liquid diet without nausea or HDMode.be SOB on exertion. No fevers.sitting in bed, alert and oriented, NAD.          Vitals - 99, 81, 20, 137/72, 87% on RA, 91% on 2L O2          HEENT - no oral thrush or petechiae          Lungs - bilaterally good air entry, no rhonchi          Abdomen - soft, nontender.  7/3 - hemoglobin 9.3, WBC 9300, ANC 7900, platelets 140K.  1. Carcinoma of Colon stage IV disease s/p recent FOLFIRI chemotherapy, admitted in the hospital with abdominal pain.  CT scan shows possibility of stricture obstruction. Surgeon Dr. Jamal Collin is following. Also felt to have colitis, clinically improving on current treatment and tolerating full liquids. Hypoxia, DOE - CT chest IMPRESSION: No evidence of pulmonary embolism. Multifocal patchy opacities, suggesting multifocal pneumonia, possibly on the basis of aspiration. Small bilateral pleural effusions, increased, with associated lower lobe atelectasis. Fluid-filled esophagus, suggesting esophageal reflux dysmotility orreflux. Still requiring [place] O2, agree with empiric antibx coverage for pneumonia, supportive treatment.continue to follow as indicated.  Electronic Signatures: Jonn Shingles (MD)  (Signed on 04-Jul-15 16:57)  Authored  Last Updated: 04-Jul-15 16:57 by Jonn Shingles (MD)

## 2014-10-09 NOTE — Consult Note (Signed)
PATIENT NAME:  Whitney Meza, Whitney Meza MR#:  [id number] DATE OF BIRTH:  07-24-1940  DATE OF CONSULTATION:  04/07/2014  REFERRING PHYSICIAN:  Juluis Mire, MD  CONSULTING PHYSICIAN:  Taima Rada R. Ma Hillock, MD  (Dictation Anomaly) <<MISSING TEXT>>   REASON FOR CONSULTATION:  Stage IV (Dictation Anomaly) <<MISSING TEXT>> cancer admitted with small bowel obstruction.    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  The patient has had multiple previous treatments (Dictation Anomaly) <<MISSING TEXT>>.  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  She is now admitted with (Dictation Anomaly) <<MISSING TEXT>> nausea and vomiting and poor oral intake (Dictation Anomaly) <<MISSING TEXT>> partial small bowel obstruction (Dictation Anomaly) <<MISSING TEXT>> small amount of stool in the colostomy bag yesterday.  (Dictation Anomaly) <<MISSING TEXT>>    DICTATION ENDS HERE <<MISSING TEXT>>    ____________________________ Rhett Bannister. Ma Hillock, MD srp:AT D: 04/07/2014 83:41:96 ET T: 04/08/2014 00:44:36 ET JOB#: [id number]  cc: Gaige Fussner R. Ma Hillock, MD, <Dictator>

## 2014-10-09 NOTE — Consult Note (Signed)
   Comments   Pt and family have decided on transfer to [place]. Ginny Ward, RN, liason for the [facility] notified and will see pt. Hospice Home orders entered.   Electronic Signatures: Tiernan Suto, Izora Gala (MD)  (Signed 22-Oct-15 13:46)  Authored: Palliative Care   Last Updated: 22-Oct-15 13:46 by Sione Baumgarten, Izora Gala (MD)

## 2014-10-09 NOTE — H&P (Signed)
PATIENT NAME:  Whitney Meza, Whitney Meza MR#:  [id number] DATE OF BIRTH:  January 01, 1941  DATE OF ADMISSION:  12/15/2013  PRIMARY CARE PHYSICIAN: Dr. Ria Bush   REFERRING PHYSICIAN: Dr. Graciella Freer.   CHIEF COMPLAINT: Abdominal pain.   HISTORY OF PRESENT ILLNESS: This patient is a 74 year old, pleasant white female with past medical history of stage IV colon cancer, currently undergoing chemotherapy. Last chemotherapy was one week back. Comes to the Emergency Department with complaints of abdominal pain, started since last night. The patient woke up in the middle of the night with the pain 10/10 in intensity. The patient took her pain medications, without much improvement. Concerning this, came to the Emergency Department this morning. The patient was given IV pain medication and was also seen by Dr. Oliva Bustard. The patient was given prescription for Dilaudid. After going home, the patient had recurrence of the abdominal pain by the afternoon. The patient took two pills of Dilaudid without much improvement. Concerning this, came back to the Emergency Department. The patient states she has not had any colostomy output since last night. KUB done in the Emergency Department showed bowel gas pattern is consistent with a partial distal small bowel obstruction or mild ileus. The patient is also found to have elevated white blood cell count of 20,000 with a left shift. However, the patient states that the patient has received Neupogen after the chemotherapy. Denied having any fever. The patient states that she gets these episodes periodically, with similar symptoms of decreased colostomy output. Concerning the KUB findings, the decision is made to admit the patient for further pain management as well as treatment of the ileus.   PAST MEDICAL HISTORY:  1.  Stage IV colon cancer, currently on chemotherapy.  2.  Hypertension.  3.  Hyperlipidemia.     PAST SURGICAL HISTORY: Multiple surgeries for the colon cancer.    ALLERGIES: SULFA.   HOME MEDICATIONS: 1.  Vitamin B12 1000 mcg once a day.  2.  Spironolactone 25 mg once a day.  3.  Prednisone 10 mg once a day.  4.  Potassium chloride 10 mg 2 times a day.  5.  Protonix 40 mg once a day.  6.  Multivitamin once a day.  7.  Metoclopramide 10 mg 3 times a day.  8.  Flaxseed 1 capsule once a day.  9.  Fentanyl patch transdermal every 72 hours.  10.  Dilaudid 2 mg every six hours as needed.  11.  Carafate 1 gram 4 times a day.  12.  Amlodipine 2.5 mg once a day.    SOCIAL HISTORY: Quit smoking about 30 years back. No history of drinking alcohol or using illicit drugs. Currently lives by herself.   FAMILY HISTORY: Mother 38 and living. Father died at the age of 41 from old age.   REVIEW OF SYSTEMS: CONSTITUTIONAL: Experiencing severe generalized weakness.  EYES: No change in vision.  ENT: No change in hearing.  RESPIRATORY: No cough, shortness of breath.  CARDIOVASCULAR: No chest pain or palpitations.  INDICATIONS: Has nausea, abdominal pain. No colostomy output.  GENITOURINARY: No dysuria or hematuria.  HEMATOLOGIC: No easy bruising or bleeding.  SKIN: No rash or lesions.  MUSCULOSKELETAL: No joint pains and aches.  NEUROLOGIC: No weakness or numbness in any part of the body.   PHYSICAL EXAMINATION: GENERAL: This is a thin built female, lying down in the bed, looks lethargic. VITAL SIGNS: Temperature 97.7, pulse 63, blood pressure 119/57, respiratory rate of 18, oxygen saturation 100% on 2  liters of oxygen.  HEENT: Head normocephalic, atraumatic. There is no scleral icterus. Conjunctivae normal. Pupils equal and react to light. Extraocular movements are intact. Mucous membranes: Mild dryness. No pharyngeal erythema.  NECK: Supple. No lymphadenopathy. No JVD. No carotid bruit.  CHEST: Has no focal tenderness.  LUNGS: Bilaterally clear to auscultation.  HEART: S1, S2 regular. No murmurs are heard.  ABDOMEN: Bowel sounds present. Soft,  nontender, nondistended.  EXTREMITIES: No pedal edema. Pulses 2+.  SKIN: No rash or lesions.  ABDOMEN: Bowel sounds present. Soft. Has tenderness diffusely. Could not appreciate any hepatosplenomegaly.  EXTREMITIES: No pedal edema. Pulses 2+. NEUROLOGIC: The patient is alert, oriented to place, person, and time. Cranial nerves II through XII intact. Motor 5/5 in upper and lower extremities.   LABORATORIES: KUB shows mild ileus. Potassium of 3.3. The rest of all the values are within normal limits.   CBC: WBC of 20.3, hemoglobin 11.4 platelet count of 160.   ASSESSMENT AND PLAN: The patient is a 74 year old female with known history of stage IV colon cancer who comes to the Emergency Department with abdominal pain.  1.  Abdominal pain. Concerning about patient's elevated white cell count with a left shift and abdominal pain, the possibility of mild ileitis or colitis. We will keep the patient on ciprofloxacin and Flagyl, as the patient is currently on chemotherapy. Continue with intravenous fluids. Keep the patient on nothing by mouth. The patient is currently not vomiting, does not need any nasogastric tube.  2.  Stage IV colon cancer, currently undergoing chemotherapy under close follow-up of Dr. Oliva Bustard.  3.  Hypertension. Continue with amlodipine and Spironolactone. 4.  Hypokalemia. Will replace by intravenous.  5.  Keep the patient on deep vein thrombosis prophylaxis with Lovenox.   TIME SPENT: 50 minutes.    ____________________________ Monica Becton, MD pv:cg D: 12/16/2013 00:37:15 ET T: 12/16/2013 01:34:30 ET JOB#: [id number]  cc: Monica Becton, MD, <Dictator> Monica Becton MD ELECTRONICALLY SIGNED 12/24/2013 0:29

## 2014-10-09 NOTE — Consult Note (Signed)
   Comments   I met with pt, her daughter, her pastor and pastor's family. Updated daughter on pt's current condition. Daughter understands that pt may be approaching end of life. I presented the option of Hospice Home. Daughter also asked about taking her mother home with hospice though I explained that pt is requiring 24/7 care and needs aggressive pain management. Daughter was appropriately emotional but very supportive of any decision pt makes. Doristine Bosworth and his wife also very supportive.  understands her limited options. She will discuss further with family. I will follow up.    Electronic Signatures: Bria Sparr, Izora Gala (MD)  (Signed 22-Oct-15 13:01)  Authored: Palliative Care   Last Updated: 22-Oct-15 13:01 by Mayerly Kaman, Izora Gala (MD)

## 2014-10-09 NOTE — Consult Note (Signed)
PATIENT NAME:  Whitney Meza, Whitney Meza MR#:  [id number] DATE OF BIRTH:  Feb 24, 1941  DATE OF CONSULTATION:  11/05/2013  REFERRING PHYSICIAN:  Dr. Oliva Bustard  CONSULTING PHYSICIAN:  Andria Meuse, NP  ONCOLOGIST: Dr. Maree Erie GASTROENTEROLOGIST:  Dr. Lucilla Lame   REASON FOR CONSULTATION: GI bleed and ileus.   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 In 2012, she was diagnosed with peritoneal carcinomatosis stage IV disease. She was previously followed by Dr. Lyda Kalata at [facility] and [facility] and recently, 2 months ago, sought treatment with Dr. Oliva Bustard. At this time, she is on FOLFIRI and ramucirumab. She did have an episode of bleeding about a year ago but was taking ibuprofen at that time. Her 3-way abdomen shows ileus versus small bowel obstruction and bilateral pleural effusions. Five days ago, she noticed a scant amount of bleeding while she was in the shower from her ostomy site.  The blood was bright red with clots about 20 mL. She did have a small amount of pain at her ostomy site. She has noticed some increased bloating. She had been taking some stool softeners as she had gone 2 days without ostomy output. This morning, just prior to my arrival, she did have a large amount of brown stool in her colostomy bag without any bleeding or clots. Her debulking surgery and colostomy was in 2011 and she was initially diagnosed with cancer in 2010. Her hemoglobin was 12.9 upon admission, down to 10.6 today. She denies any significant abdominal pain at this time. She has not had vomiting.  She denies any NSAID use.   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  Oliva Bustard, ileus, history of GI bleed from colostomy site, hypertension, arthritis, HSV, conjunctivitis, macular degeneration, knee surgery, spine surgery, appendectomy, cholecystectomy, tumor  debulking exploratory surgery, colectomy, tonsillectomy.   MEDICATIONS PRIOR TO ADMISSION: Carafate 1 gram q.i.d.  Eyedrops, she cannot remember the name, in her left eye.  Flaxseed oil daily, multivitamin daily, pantoprazole 40 mg b.i.d., spironolactone 25 mg daily, vitamin B12 at 1000 mcg daily.   ALLERGIES: SULFA CAUSES SHORTNESS OF BREATH AND FAINTING.   FAMILY HISTORY: Maternal uncle and paternal uncle both had colon cancer, another uncle with renal carcinoma.   SOCIAL HISTORY: She has remote history of tobacco use. Denies any alcohol or drug use. She is a widow. She has 1 grown daughter. She lost 1 son in a motor vehicle accident.   REVIEW OF SYSTEMS:  See HPI.  CONSTITUTIONAL: She has weakness and fatigue.  PULMONARY: She has some shortness of breath on exertion.  PSYCHIATRIC: She has some anxiety.   Otherwise, negative 12-point review of systems.   PHYSICAL EXAMINATION:  VITAL SIGNS: Temperature 97.2, pulse 81, respirations 20, blood pressure 144/87, oxygen saturation 90% on room air.  GENERAL: She is a pleasant, cooperative Caucasian female in no acute distress.  She has her friend at her bedside.  HEENT: Sclerae clear on right; left, cloudy. Conjunctivae pink. Oropharynx pink and moist without any lesions.  NECK: Supple without mass or thyromegaly.  CHEST: Heart regular rate and rhythm. Normal S1, S2 without murmurs, clicks, rubs, or gallops.  LUNGS: Clear to auscultation bilaterally.  ABDOMEN: She has an ostomy, left abdomen with large amount of brown, nonbloody stool.  Does have multiple palpable nodes and some nodularity. Liver is 4 fingerbreadths below the right costal margin.  Unable to palpate splenomegaly. There is no rebound, tenderness, or guarding. EXTREMITIES: Without clubbing or edema bilaterally.  SKIN: Pink, warm, and dry without any rash or jaundice.  NEUROLOGICAL:  Grossly intact.  MUSCULOSKELETAL: Good equal movement and strength bilaterally.  PSYCHIATRIC:  Alert and cooperative. Normal mood and affect.   LABORATORY DATA:  Magnesium 1.9, total protein 5.8, albumin 2.6, alkaline phosphatase 190, AST 87, ALT 77, platelets 156,000. White blood cell count 6. Otherwise, as mentioned in the HPI. INR is 0.9. Blood cultures negative for growth.   IMPRESSION:  This patient is a very pleasant 74 year old Caucasian female with history of stage IV colon cancer with peritoneal carcinomatosis followed by Dr. Oliva Bustard. CT shows ileus and after 2 days without colostomy output, she has now had a large amount of nonbloody output this morning. She did have moderate amount of red blood from ostomy, but this has resolved currently. Differentials include bleeding from tumor burden, adhesions, arteriovenous malformation, or ischemia. Since her hemoglobin is stable, I believe I would hold off on endoscopic evaluation at this time unless she has ongoing bleed requiring intervention.   PLAN:  1. Advance to small amounts of clear liquid as tolerated.  2. Monitor hemoglobin and hematocrit and ostomy output.  3. Continue supportive measures.   Thank you for allowing [place] to participate in her care.    ____________________________ Andria Meuse, NP klj:dd D: 11/05/2013 21:17:31 ET T: 11/05/2013 21:34:22 ET JOB#: [id number]  cc: Andria Meuse, NP, <Dictator> Martie Lee. Oliva Bustard, MD  Andria Meuse FNP ELECTRONICALLY SIGNED 11/10/2013 15:24

## 2014-10-09 NOTE — Consult Note (Signed)
PATIENT NAME:  Whitney Meza, Whitney Meza MR#:  [id number] DATE OF BIRTH:  Sep 18, 1940  DATE OF CONSULTATION:  04/08/2014  REQUESTING PHYSICIAN:  Fritzi Mandes, MD  CONSULTING PHYSICIAN:  S.G. Jamal Collin, MD  REASON FOR CONSULTATION:  Recurrence of small bowel obstruction.   HISTORY: This is an unfortunate 74 year old female who has a known stage IV colon cancer with a recent history of some recurring episodes of small bowel obstruction. She was admitted in June of this year and treated conservatively and her symptoms resolved.  At that time review of her prior history including the last operation she had which was for the attempted colon resection on the left side and revision of colostomy, was extremely tedious, and involved multiple enterotomies and numerous hours of surgery.  There was evidence of diffuse carcinomatosis at that time.  Based on that and the fact that the patient has had no significant response to chemotherapy, surgery was not considered an option.  The patient presents now with a similar complaint of abdominal pain, some nausea and vomiting and the colostomy not functioning for several days.   PHYSICAL EXAMINATION:  The patient is not in any acute distress, but she is certainly somewhat ill-appearing and weak.  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  Bowel sounds are hypoactive. No inguinal hernias are noted. The patient's CT scan was reviewed and shows some areas of distention in the areas of normal-appearing bowel with multiple areas involved in this fashion. There is a significant thickening of the stomach and it does not appear that her stomach comes up to the anterior wall of the  abdomen in any position for a possible attempt at PEG. 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   IMPRESSION AND RECOMMENDATIONS: In full detail this was discussed with Dr. Posey Pronto and the patient.   This patient is not an operative candidate and as such, given the severity of the stage IV disease and the current situation of obstruction, hospice management is more than likely appropriate and noted that the palliative care be consult has been obtained.  If there are any other questions, I would be happy to discuss this with the patient and family or the palliative care.  Earlier, I had discussed this with Dr. Oliva Bustard who has been her primary oncologist.   Thank you for allowing me to evaluate and help in the care of this patient.     ____________________________ S.Robinette Haines, MD sgs:DT D: 04/08/2014 11:38:59 ET T: 04/08/2014 12:07:52 ET JOB#: [id number]  cc: Synthia Innocent. Jamal Collin, MD, <Dictator> [facility] Robinette Haines MD ELECTRONICALLY SIGNED 2014/04/23 8:36

## 2014-10-09 NOTE — Consult Note (Signed)
PATIENT NAME:  Whitney Meza, Whitney Meza MR#:  [id number] DATE OF BIRTH:  Apr 05, 1941  DATE OF CONSULTATION:  12/18/2013  Consultation was done over the last couple of days, awaiting additional records from [facility].   REFERRING PHYSICIAN: Janak K. Choksi, MD CONSULTING PHYSICIAN:  S.G. Jamal Collin, MD  REASON FOR CONSULTATION: Suspected small bowel obstruction.   HISTORY: This is a 74 year old female who, in 2010, underwent right hemicolectomy for carcinoma and has since been on chemotherapy off and on over the years. She has peritoneal carcinomatosis and has received chemotherapy in several settings over the last 4 to 5 years. A year or so out from her initial surgery, the patient developed colovesical fistula, at which point she was reoperated on, and her op note was reviewed today, and it appears that she had fairly extensive adhesions which required several hours of adhesiolysis before a sigmoid resection could be completed. In the course of this, multiple serosal tears and an enterotomy in the transverse colon were made, all of which were repaired. The patient has since been followed by oncology and has been receiving chemotherapy. Her problem has been that intermittently she has episode of abdominal pain with nausea and vomiting, and the colostomy stops working. She had another similar episode which brought her to the hospital this time. Since admission, the patient has been kept n.p.o. and on IV fluids, and in the last 2 to 3 days, the patient has not had any nausea or vomiting, and her abdominal pain has pretty much completely resolved. As of last evening and this morning, the colostomy has started to function, with a moderate amount of stool, and the patient feels significantly better.   PAST MEDICAL HISTORY: Includes hypertension, which is under control. Has some symptoms of reflux. Otherwise, free of any significant major health issues.   PHYSICAL EXAMINATION:  GENERAL: The patient is a very pleasant  female who is certainly not in any kind of distress at this time. She seems fairly comfortable and has been up walking without any difficulty.  HEENT: Sclerae are nonicteric. Conjunctivae are pink. Tongue is moist, pink and clear.  NECK: Supple. No nodes or masses palpable.  LUNGS: Clear to auscultation and percussion.  HEART: In sinus rhythm, without any murmurs.  ABDOMEN: Reveals a soft abdomen. There is no tenderness, and the midline incision in the abdomen is well-healed. No hernia is identified. Her colostomy is functioning well. The patient has active bowel sounds.   LABORATORY DATA: Her initial white count was up at 20,000, but has since returned to normal within 24 hours and is now 8500. Hemoglobin is 10. Liver function showed mild elevation of alkaline phosphatase, and chemistries otherwise are showing a potassium of 3.3 and glucose of 150.   DATA REVIEW: Her op note from [facility] and also her op note from the initial surgery were reviewed. A CT scan that was done this time also was reviewed.   IMPRESSION: Based on her history, the patient appears to have intermittent obstruction involving her small bowel, and it appears to resolve with conservative management each time. The frequency with which this has occurred has been every few months and has not been occurring in a rapid frequency to necessitate further intervention. Based on her last surgery, it appears the patient likely has multiple areas of adhesion. Current CT was reviewed, and although there is one area in the lower abdomen where there is an actual what appears to be a transition point, it also appears that  there may be multiple other transition points. There was moderate distention of the small bowel. Based on her history and the findings at the previous surgery, I feel that a repeat laparotomy at this time may be treacherous. The patient, at this point, appears to have recovered from her episode of small bowel obstruction. She  is tolerating clear liquids, colostomy is functioning and she is pain free.   RECOMMENDATION: I feel it would be prudent to advance her diet slowly to a full liquid status and likely will keep her at that level for quite some time to minimize potential recurrence of her symptoms. I have discussed this in detail with the patient and her daughter. She can be advanced to a full liquid diet at this time, and if she tolerates this well enough and has no recurrence of abdominal symptoms, she can be discharged tomorrow morning. I will discuss this further with Dr. Oliva Bustard on his return next week, and I would be happy to see her back in consultation if there is a recurrence of her problem.   Thank you for allowing me to evaluate and help in the care of the patient.   ____________________________ S.Robinette Haines, MD sgs:lb D: 12/18/2013 09:07:51 ET T: 12/18/2013 09:32:14 ET JOB#: [id number]  cc: S.G. Jamal Collin, MD, <Dictator> [facility] Robinette Haines MD ELECTRONICALLY SIGNED 12/19/2013 9:18

## 2014-10-09 NOTE — Consult Note (Signed)
PATIENT NAME:  Whitney Meza, STIEFEL MR#:  [id number] DATE OF BIRTH:  04-29-1941  DATE OF CONSULTATION:  04/07/2014  REFERRING PHYSICIAN:  Juluis Mire, MD  CONSULTING PHYSICIAN:  Dolph Tavano R. Ma Hillock, MD  REASON FOR CONSULTATION:  Recurrent stage IV colon cancer with symptoms of small bowel obstruction.   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 Oliva Bustard. She has had multiple treatments at [facility] and done here including FOLFOX, FOLFIRI and Avastin, FOLFIRI and ramucirumab, then on Vectibix. She has been on a chemotherapy holiday since September 25. The patient is now admitted to the hospital with progressive nausea and vomiting and abdominal bloating. Decreased stools in the colostomy bag. She had a CT scan of the abdomen and pelvis done, which shows acute-on-chronic multifocal moderate-to-high-grade small bowel obstructions throughout the abdomen, extensive small bowel tethering suggestive of widespread adhesive disease. The patient today states that she feels about the same. She is overall significantly weak, lives alone, and does some activity trying to care for herself. Currently denies any ongoing pain. Denies any blood in stools.   PAST MEDICAL HISTORY AND PAST SURGICAL HISTORY: 1.  Stage IV recurrent colon cancer as described above.  2.  Hypertension.  3.  Recurrent small bowel obstruction.  4.  Multiple surgeries associated with colon cancer.   HOME MEDICATIONS: Norco 5/325 mg 1 tablet q. 6 hours, albuterol inhaler q.i.d. p.r.n., alprazolam 0.25 mg 1 tablet t.i.d. p.r.n. anxiety, amlodipine 2.5 mg daily, Dexilant 60 mg daily, Duragesic patch 25 mcg q. 72 hours patch, Reglan 10 mg q.i.d., prednisone 10 mg daily, spironolactone 25 mg daily, sucralfate 1 gram q.i.d.   ALLERGIES:  SULFA.   SOCIAL HISTORY:  Ex-smoker, quit 30 years ago. Denies  history of alcohol usage. Lives by herself, but daughter lives next-door and helps to take care of her also.   FAMILY HISTORY:  Noncontributory.   REVIEW OF SYSTEMS: CONSTITUTIONAL:  Generalized weakness. No fevers or chills. No night sweats.  HEENT:  No new headaches or dizziness at rest. Denies epistaxis or ear or jaw pain.  CARDIAC:  No angina, palpitations, orthopnea, or PND.  LUNGS:  Denies any new dyspnea, cough, hemoptysis, or chest pain.  GASTROINTESTINAL:  As in HPI.  GENITOURINARY:  No dysuria or hematuria.  MUSCULOSKELETAL:  No new bone pains.  EXTREMITIES:  No new swelling or pain.  NEUROLOGIC: No new focal weakness, seizures, or loss of consciousness.  ENDOCRINE:  No polyuria or polydipsia. Appetite is poor.   LABORATORY RESULTS:  From October 20, hemoglobin 8.4, platelets 286, WBC 7000, ANC 5300. Creatinine 0.65, calcium 9.5. LFTs unremarkable excepting low albumin of 2.9.   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 The patient also had generalized weakness and does limited activity. She is currently on a break from chemotherapy. She has received multiple lines of chemotherapy in the past. Per review of Dr. Metro Kung notes from recent visit and discussion with the patient, they are awaiting approval of a new medication since she still wants to keep the fight going. I have explained to the patient that overall the disease is incurable and prognosis is poor, especially if her symptoms of bowel obstruction do not  improve with conservative treatment and allow her to have some oral intake and nutrition. I have also discussed with the patient that if bowel obstruction symptoms do not improve and she continues to decline and get weaker then she  would not be a candidate to take further cancer treatment. The patient states that she does understand these scenarios but wants to continue on current supportive treatment to see if she does get better with bowel obstruction symptoms and then hopefully can try the new treatment. Continue current supportive treatment, pain medication, and conservative management of small bowel obstruction. The patient is DO NOT RESUSCITATE. Palliative care is also following to help discuss goals of therapy and make further plan of management, especially if her condition does not improve. Oncology will continue to follow as indicated.   Thank you for the referral, please feel free to contact me for additional questions.    ____________________________ Rhett Bannister Ma Hillock, MD srp:nb D: 04/07/2014 23:54:59 ET T: 04/08/2014 00:36:56 ET JOB#: [id number]  cc: Trevor Iha R. Ma Hillock, MD, <Dictator> Alveta Heimlich MD ELECTRONICALLY SIGNED 04/08/2014 9:36

## 2014-10-09 NOTE — H&P (Signed)
PATIENT NAME:  Whitney Meza, Whitney Meza MR#:  [id number] DATE OF BIRTH:  1940/09/12  DATE OF ADMISSION:  04/07/2014  REFERRING DOCTOR:  Loney Hering, MD.  PRIMARY CARE DOCTOR:  Ria Bush, MD.  PRIMARY ONCOLOGIST:  Martie Lee. Choksi, MD.    ADMITTING DOCTORJuluis Mire, MD.  CHIEF COMPLAINT: Abdominal pain with associated vomiting of 1 day's duration.   HISTORY OF PRESENT ILLNESS: Ms. Whitney Meza is a 74 year old pleasant Caucasian female with past medical history significant for stage IV colon cancer, off chemotherapy for the past 3 weeks, awaiting possible initiation of newly-approved medications; history of recurrent small bowel obstructions, history of hypertension and history of biliary duct obstructions, presents to the Emergency Room with the complaints of abdominal pain associated with some vomiting of clear liquids of 1 day's duration.   The patient states that she was in her usual state of health until yesterday afternoon. She started developing abdominal pain, which further worsened as the day went by and she developed vomiting of clear liquids. Hence, she came to the Emergency Room for further evaluation. She denies any fever, no chest pain, no shortness of breath, no cough, no urinary symptoms. The patient does have a history of previous admissions with similar symptoms which were relieved with medical management since she is not a surgical candidate. In the Emergency Room the patient was evaluated by the ED physician and noted to have CT scan of the abdomen which was significant for acute on chronic small bowel obstruction, and also acute on chronic biliary duct dilatation. Hence, general surgery was consulted by the ED physician and since the patient is not a surgical candidate in view of the stage IV colon cancer, medical management was recommended by the general surgery. The ER physician also contacted the patient's oncologist and the oncologist also suggested medical management.  Hence, the medical team was consulted for further continuation of care.   The patient received IV fluids and some pain medications following which her pain is under reasonable control at this time. She does use fentanyl patch at home and p.r.n. pain medications. At present she is comfortably resting in the bed, denies any ongoing nausea or vomiting at this time. As mentioned earlier, no history of any fever, chills, cough or chest pain, shortness of breath or urinary symptoms.   PAST MEDICAL HISTORY:  1.  Stage IV colon cancer, off chemotherapy for the past 3 weeks, awaiting for the possible initiation of newly-approved medications shortly.  2.  History of hypertension.  3.  History of recurrent small bowel obstructions with relief of symptoms with medical management.   PAST SURGICAL HISTORY: Multiple surgeries associated with colon cancer.   HOME MEDICATIONS: Acetaminophen/hydrocodone 325/5 mg 1 tablet every 6 hours, albuterol inhaler 90 mcg 2 puffs 4 times a day as needed, alprazolam 0.25 mg tablet 1 tablet 3 times a day as needed for anxiety, amlodipine 2.5 mg tablet 1 tablet once a day, Dexilant 60 mg 1 capsule orally once a day, Duragesic patch 25 mg transdermal film extended release 1 patch every 72 hours, metoclopramide 10 mg oral tablet 1 tablet 4 times a day, prednisone 10 mg 1 tablet orally once a day, spironolactone 25 mg 1 tablet orally once a day, sucralfate 1 gram/10 mg oral suspension 4 times a day before meals and at bedtime.   ALLERGIES: SULFA.   SOCIAL HISTORY: History of smoking in the past, quit 30 years back. No history of alcohol usage or illicit drug usage. She  lives by herself in close proximity to her daughter and she is able to take care of herself, ambulatory at home.   FAMILY HISTORY: Significant for mother who is living at 30 years of age; father died at the age 54 of old age. Negative for any cancers.   REVIEW OF SYSTEMS:   CONSTITUTIONAL: Negative for fever. She  does have generalized fatigue and weakness.  EYES: Negative for blurred vision or double vision. No pain, no redness no pain, redness or inflammation.  EARS, NOSE, AND THROAT: Negative for tinnitus, ear pain, hearing loss; negative for epistaxis, nasal discharge. No difficulty swallowing.  RESPIRATORY: Negative for cough, wheezing, hemoptysis, dyspnea or painful respiration.   CARDIOVASCULAR: Negative for chest pain, orthopnea, shortness of breath, palpitations, syncope or dizziness.  GASTROINTESTINAL: Positive for nausea and vomiting and abdominal pain, which started yesterday, with further progression as the day went by. No blood in the vomitus. No blood in the stool in the colostomy bag. GENITOURINARY: Negative for dysuria, hematuria, frequency.  ENDOCRINE: Negative for polyuria or polydipsia. No heat or cold intolerance.  HEMATOLOGIC:  Negative for easy bruising or bleeding.  INTEGUMENTARY: Negative for acne, skin lesions or rash.  MUSCULOSKELETAL: Negative for any arthritis, swelling, joint swelling or gout.  NEUROLOGICAL: Negative for weakness, numbness. No history of CVA, TIA or seizure episodes. PSYCHIATRIC: She does have anxiety but otherwise denies any insomnia, or depression.   PHYSICAL EXAMINATION:  VITAL SIGNS:  Pulse rate 67, temperature 97.5, respirations 20, blood pressure 124/48.  Oxygen saturation 98% on room air. Current vital signs: Pulse 67, respirations 18, blood pressure 120/66, oxygen saturation 93% on room air.  GENERAL: Elderly lady, well nourished, comfortable resting in the bed, not in any acute distress. Pleasant and cooperative.  HEENT: Head atraumatic, normocephalic. Eyes: Pupils equal and reacting to light. No conjunctival pallor present. No scleral icterus. Extraocular movements intact. Nose: No nasal lesions noted. No drainage. Ears: No drainage. No external lesions. Mouth: Dry oral mucosa; no mucosal lesions, no exudates.  NECK: Supple. No JVD. No thyromegaly, no  carotid bruit. Range of motion of neck normal.  RESPIRATORY: Bilateral vesicular breath sounds present. Good air entry. Not using accessory muscles of respiration.  CARDIOVASCULAR: S1, S2 regular, no murmurs appreciated; peripheral pulses equal at carotid, femoral and pedal pulses bilaterally. No pedal edema.  GASTROINTESTINAL: Abdomen is soft. Colostomy site with bag. Little amount of stool in the colostomy bag present. Bowel sounds are diminished. Some diffuse, moderate tenderness present and diffuse mass is also palpable.  GENITOURINARY: Deferred.  MUSCULOSKELETAL: Normal range of motion of both upper and lower extremities.  SKIN: Inspection within normal limits.  LYMPHATIC: No cervical lymphadenopathy.  VASCULAR: Good dorsalis pedis and posterior tibial pulses.  NEUROLOGICAL: Alert, awake, and oriented x 3. Cranial nerves II-XII grossly intact. No sensory or motor deficits. Reflexes 2+ bilaterally, symmetrical.  PSYCHIATRIC: Judgment and insight adequate; alert and oriented x 3. Memory and mood within normal limits.   LABORATORY DATA: Serum glucose 113, BUN 13, creatinine 0.65, sodium 141, potassium 3.2, chloride 104, bicarbonate 30, total serum calcium 9.5, magnesium 1.5, lipase 409, total protein 6.2, albumin 2.9, AST 18, ALT 16, troponin less than 0.02.  CBC: WBC 7.0, hemoglobin 8.4, hematocrit 28.1, platelet count 286, MCV 81, pro time 12.9, INR 1.0.   URINALYSIS: No bacteria.   IMAGING STUDIES: Three-view abdominal x-ray films show bilateral small pleural effusions. No evidence of bowel obstruction or perforation.   CT of the abdomen and the pelvis with contrast show:  1.  Acute on chronic multifocal moderate to high-grade small bowel obstruction throughout the abdomen. There is extensive small bowel thickening suggesting widespread adhesive disease. Part of the increasing obstruction today may be due to increased small bowel parastomal hernia.  2.  Small volume of free fluid in the left  abdomen. No free air.  3.  Acute on chronic biliary ductal dilatation may be sequela of #1.  4.  Increased layering pleural effusions, small to moderate.  5.  Continued severe abdominal/gastric wall thickening. Recommend endoscopic followup if not already done.  6.  Otherwise, no focal metastatic disease identified in the abdomen or pelvis; but as before peritoneal metastases are difficult to exclude given the small bowel appearance.   EKG: Sinus bradycardia, rate 58 beats per minute.   ASSESSMENT AND PLAN:  A 74 year old Caucasian female with a past medical history significant for stage IV colon cancer, history of recurrent small bowel obstructions, history of hypertension, presents with complaints of abdominal pain with associated vomiting for 1 day duration.  1.  Small bowel obstruction, which is recurrent, now acute on chronic.  The ED physician contacted general surgeon on-call as well as oncologist on-call and per both specialists this  patient is not a surgical candidate in view of her stage IV colon cancer, hence they recommended medical management. Hence, the patient is admitted to the medical floor for conservative management. Plan: Admit to medical floor. N.p.o. except meds, IV fluids. No NG tube suction per general surgery because of stage IV colon cancer. Follow up clinically. Follow up with general surgery recommendations. General surgery consultation requested.  2.  Hypokalemia. Plan: Potassium replacement and follow BMP.  3.  Hypomagnesemia. Plan: Replace magnesium, follow magnesium levels. 4.  Stage IV colon cancer, off chemotherapy for the past 3 weeks. Awaiting for possible initiation of newly-approved medications. The patient is under care of oncologist. Continue care per oncology.  Oncology consult requested.  5.  Hypertension. Controlled on home medications. Continue same.  6.  Biliary ductal dilatation on CT scan, acute on chronic.  Continue medical management. Follow surgical  recommendations.   CODE STATUS: Full code at this time.  Discussed status.  Palliative care consult requested to evaluate for course of treatment going forward.   TIME SPENT: 55 minutes.    ____________________________ Juluis Mire, MD enr:lt D: 04/07/2014 07:17:50 ET T: 04/07/2014 08:21:38 ET JOB#: [id number]  cc: Juluis Mire, MD, <Dictator> Martie Lee. Oliva Bustard, MD Ria Bush, MD Juluis Mire MD ELECTRONICALLY SIGNED 04/11/2014 4:51

## 2014-10-09 NOTE — Discharge Summary (Signed)
Dates of Admission and Diagnosis:  Date of Admission 04-Nov-2013   Date of Discharge 09-Nov-2013   Admitting Diagnosis small bowel obstruction   Final Diagnosis small bowel obstruction vs. paralytic ileus gI bleeding. 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 Now being treated with reduced dose of  FOLFOX  progressing disease by CT scan criteria Patient has been started on   FOLFIRI  AND  AVASTIN( April, 2015) switched to FOLFIRI    and  RAMUCIRUMAB on May of 2015 ?? HPI  Patient is here for ongoing evaluation because of increasing abdominal discomfort.  Patient had developed diarrhea nausea vomiting also had   BROWN color vomitus Had a diarrhea of frequent for last day and then see tOOK2 Imodium following that patient did not pass any gas or stool and having increasing abdominal discomfort.  Was admitted in the hospital for further evaluation, intravenous fluid.  After three-way abdomen is evaluated possibility of CT scan and surgical evaluation..   Allergies:  Sulfa drugs: Dizzy/Fainting, SOB  Hepatic:  21-May-15 06:08   Bilirubin, Total 0.5  Alkaline Phosphatase  190 (45-117 NOTE: New Reference Range 05/08/13)  SGPT (ALT) 77  SGOT (AST)  86  Total Protein, Serum  5.8  Albumin, Serum  2.6  22-May-15 05:23   Bilirubin, Total 0.4  Alkaline Phosphatase  158 (45-117 NOTE: New Reference Range 05/08/13)  SGPT (ALT) 54  SGOT (AST)  54  Total Protein, Serum  5.3  Albumin, Serum  2.3  Routine Chem:  21-May-15 06:08   Result Comment LABS - This specimen was collected through an   - indwelling catheter or arterial line.  - A minimum of  30mls of blood was wasted prior    - to collecting the sample.  Interpret  - results with caution.  Result(s) reported on 05 Nov 2013 at 06:48AM.  Glucose, Serum 92  BUN 9  Creatinine (comp) 0.65  Sodium, Serum 142  Potassium, Serum 3.5  Chloride, Serum 106  CO2, Serum 27  Calcium (Total), Serum 8.7  Anion Gap 9  Osmolality (calc) 281  eGFR (African American) >60  eGFR (Non-African American) >60 (eGFR values <1mL/min/1.73 m2 may be an indication of chronic kidney disease (CKD). Calculated eGFR is useful in patients with stable renal function. The eGFR calculation will not be reliable in acutely ill patients when serum creatinine is changing rapidly. It is not useful in  patients on dialysis. The eGFR calculation may not be applicable to patients at the low and high extremes of body sizes, pregnant women, and vegetarians.)  Magnesium, Serum 1.9 (1.8-2.4 THERAPEUTIC RANGE: 4-7 mg/dL TOXIC: > 10 mg/dL  -----------------------)  22-May-15 05:23   Result Comment LABS - This specimen was collected through an   - indwelling catheter or arterial line.  - A minimum of 64mls of blood was wasted prior    - to collecting the sample.  Interpret  - results with caution.  Result(s) reported on 06 Nov 2013 at 05:57AM.  Glucose, Serum 94  BUN  6  Creatinine (comp)  0.55  Sodium, Serum 143  Potassium, Serum  3.3  Chloride, Serum  109  CO2, Serum 27  Calcium (Total), Serum  8.1  Anion Gap 7  Osmolality (calc) 282  eGFR (African American) >60  eGFR (Non-African American) >60 (eGFR values <12mL/min/1.73 m2 may be an indication of chronic kidney disease (CKD). Calculated eGFR is useful in patients with stable renal function. The eGFR calculation will not be reliable in acutely ill patients when serum creatinine is changing rapidly. It is not useful in  patients on dialysis. The eGFR calculation may not be applicable to patients at the low and high extremes of body sizes, pregnant women,  and vegetarians.)  25-May-15 06:42   Result Comment LABS - This specimen was collected through an   - indwelling catheter or arterial line.  - A minimum of 68mls of blood was wasted prior    - to collecting the sample.  Interpret  - results with caution.  Result(s) reported on 09 Nov 2013 at 07:10AM.  Glucose, Serum  100  BUN  3  Creatinine (comp)  0.59  Sodium, Serum 142  Potassium, Serum  3.2  Chloride, Serum 107  CO2, Serum 29  Calcium (Total), Serum 8.7  Anion Gap  6  Osmolality (calc) 280  eGFR (African American) >60  eGFR (Non-African American) >60 (eGFR values <86mL/min/1.73 m2 may be an indication of chronic kidney disease (CKD). Calculated eGFR is useful in patients with stable renal function. The eGFR calculation will not be reliable in acutely ill patients when serum creatinine is changing rapidly. It is not useful in  patients on dialysis. The eGFR calculation may not be applicable to patients at the low and high extremes of body sizes, pregnant women, and vegetarians.)  Routine Hem:  21-May-15 06:08   WBC (CBC) 6.0  RBC (CBC)  3.61  Hemoglobin (CBC)  10.6  Hematocrit (CBC)  32.6  Platelet Count (CBC) 156  MCV 90  MCH 29.3  MCHC 32.5  RDW  16.2  Neutrophil % 74.0  Lymphocyte % 16.4  Monocyte % 8.1  Eosinophil % 1.2  Basophil % 0.3  Neutrophil # 4.4  Lymphocyte # 1.0  Monocyte # 0.5  Eosinophil # 0.1  Basophil # 0.0  22-May-15 05:23   WBC (CBC) 5.8  RBC (CBC)  3.47  Hemoglobin (CBC)  10.4  Hematocrit (CBC)  31.3  Platelet Count (CBC) 154  MCV 90  MCH 30.0  MCHC 33.3  RDW  16.1  Neutrophil % 68.6  Lymphocyte % 20.7  Monocyte % 8.1  Eosinophil % 2.0  Basophil % 0.6  Neutrophil # 4.0  Lymphocyte # 1.2  Monocyte # 0.5  Eosinophil # 0.1  Basophil # 0.0  25-May-15 06:42   WBC (CBC) 10.4  RBC (CBC) 3.89  Hemoglobin (CBC)  11.2  Hematocrit (CBC)  34.8  Platelet Count (CBC) 189  MCV 90  MCH 28.9  MCHC 32.2  RDW  15.8  Bands 10  Segmented  Neutrophils 66  Lymphocytes 12  Monocytes 5  Eosinophil 1  Metamyelocyte 4  Myelocyte 2  Diff Comment 1 ANISOCYTOSIS  Diff Comment 2 PLTS VARIED IN SIZE  Result(s) reported on 09 Nov 2013 at 07:10AM.   PERTINENT RADIOLOGY STUDIES: XRay:    20-May-15 18:18, Abdomen 3 Way Includes PA Chest  Abdomen 3 Way Includes PA Chest   REASON FOR EXAM:    abdominal distention, Call Dr. Oliva Bustard with report  COMMENTS:       PROCEDURE: DXR - DXR ABDOMEN 3-WAY (INCL PA CXR)  - Nov 04 2013  6:18PM     CLINICAL DATA:  History of colon cancer.  Abdomen pain    EXAM:  ABDOMEN SERIES    COMPARISON:  None.    FINDINGS:  There is no focal infiltrate. There are small bilateral pleural  effusions. Right central venous line is identified with distal tip  in the superior vena cava right atrial junction. The aorta is  tortuous. Heart size normal.    There is no free air. There are air-filled prominent small bowel  loops in the abdomen. Postsurgical changes with surgical sutures and  clips are identified in the right abdomen. Air is visualized in a  small segment of right colon.     IMPRESSION:  Ileus versus early small bowel obstruction. Followup is recommended.    Small bilateral pleural effusions.    These results will be called to the ordering clinician or  representative by the Radiologist Assistant, and communication  documented inthe PACS or zVision Dashboard.      Electronically Signed    By: Abelardo Diesel M.D.    On: 11/04/2013 18:30         Verified By: Abelardo Diesel, M.D.,  LabUnknown:  PACS Image     03-Jun-15 15:14, CT Abdomen and Pelvis With Contrast  PACS Image   CT:  CT Abdomen and Pelvis With Contrast   REASON FOR EXAM:    colon cancer abd pain  COMMENTS:       PROCEDURE: KCT - KCT ABDOMEN/PELVIS W  - Nov 18 2013  3:14PM     CLINICAL DATA:  History of colon cancer with colon resection and  chemotherapy. Severe abdominal pain. Carcinoma of ileocecal valve  with  metastasis to peritoneum. Stage IV disease. Prior treatment at  outside hospital.    EXAM:  CT ABDOMEN AND PELVIS WITH CONTRAST    TECHNIQUE:  Multidetector CT imaging of the abdomen and pelvis was performed  using the standard protocol following bolus administration of  intravenous contrast.    CONTRAST:  100 cc Isovue 370    COMPARISON:  Clinic note of 11/11/2013. Acute abdomen series of  11/05/2013. No prior CTs.    FINDINGS:  Lower Chest: Minimal subsegmental atelectasis. Normal heart size.  Small bilateral pleural effusions. Mildly dilated lower thoracic  esophagus with contrast within.    Abdomen/Pelvis: Fluid density along the liver capsule anteriorly in  the left lobe measures on the order of 3.5 x 1.2 cm on image 17.  Nonspecific.    Spot splenic cyst or lymphangioma of 1.2 cm.    Moderate to marked wall thickening involves the gastric body and  antrum. These areas are underdistended. Example image 25-28 of  series 2. No obstruction.    Normal pancreas. Gallbladder is partially contracted. Borderline  intrahepatic biliary ductal dilatation, without common duct  dilatation or obstructive process.    Normal adrenal glands. Bilateral renal cysts. Minimal right-sided  pelvicaliectasis. No dominant obstructive mass or other cause  identified.  Aortic and branch vessel atherosclerosis. No retroperitoneal or  retrocrural adenopathy.    Hartmann's pouch. Wall thickening and soft tissue fullness within  the residual rectum. Example image 62/series 2.    Left lower quadrant descending colostomy. Small bowel is positioned  at the opening to the colostomy on image 50, suggesting mild  parastomal hernia.    Surgical changes within the ascending colon. No small bowel  distension. About the medial and inferior aspect of the presumed  small bowel to colonic anastomosis are small fluid density  structures which may represent extraluminal collections. Example 2.1  x 1.7 cm  on image 43/series 2  and coronal image 42  No pelvic adenopathy. Hysterectomy. Normal urinary bladder. No  adnexal mass or significant free fluid.    Bones/Musculoskeletal:  Degenerative disc disease at L4 through S1.     IMPRESSION:  1. Status post rectal stump creation and descending colostomy.  2. Soft tissue fullness within the remaining rectumis suspicious  for either proctitis or a second area of colon carcinoma. If this  has not already been evaluated, physical exam and consideration of  sigmoidoscopy suggested.  3. Surgical changes, likely of partial right hemicolectomy. Cystic  structures adjacent to the anastomosis are indeterminate (especially  without priors for comparison). Cannot exclude postoperative  extraluminal fluid versus serosal/peritoneal metastasis.  Alternatively, this could represent nonopacified bowel (for example  if the patient had an end-to-side anastomosis). Comparison with  prior CTs versus short-term CT follow-up should be considered.  4. Moderate to marked gastric wall thickening, suspicious for either  gastritis or gastric carcinoma. Consider endoscopy.  5. Small bilateral pleural effusions with esophageal distention,  suggesting dysmotility or gastroesophageal reflux.  6. Small volume fluid density along the left hepatic capsule is  nonspecific. This could relate to postoperative fluid. However,  given the history of peritoneal/serosal metastasis, this is an  alternate concern.  7. Minimal parastomal hernia containing nonobstructive small bowel.  8. Mild right-sided pelvicaliectasis, without cause identified.    Electronically Signed    By: Abigail Miyamoto M.D.    On: 11/18/2013 15:38         Verified By: Areta Haber, M.D.,   Pertinent Past History:  Pertinent Past History ?? Comments Family history of colon cancer in maternal uncle. Carcinoma of the kidney in maternal uncle. Carcinoma of colon paternal uncle and family history of breast  cancer ?? Social History negative alcohol, negative tobacco, quit smoking in 1997 ?? Additional Past Medical and Surgical History hypertensin Arthritis Herpes simplex conjunctivitis macular degeneration, knee surgery spine surgery l4  and l5 fusion   Hospital Course:  Hospital Course during hospital stay patient was started on known IV antibiotics.  not examined.p.o.  Patient had rectal bleeding.  Patient also with myelosuppression was started on Neupogen and GI consultation has been obtained. CT scan revealed possibility of partial small bowel obstruction vs. pyloric ileus Gradually patient's condition improved diet was increased VC tolerated very well. Electrolyte imbalance was corrected. Patient's overall prognosis was guarded because of her underlying condition. he bleeding continues then possibility of   luminal  evaluation would be planned   Condition on Discharge Guarded   Code Status:  Code Status Full Code   DISCHARGE INSTRUCTIONS HOME MEDS:  Medication Reconciliation: Patient's Home Medications at Discharge:     Medication Instructions  carafate 1 g oral tablet  1 tab(s) orally 4 times a day (before meals and at bedtime)   spironolactone 25 mg oral tablet  1 tab(s) orally once a day   amlodipine 2.5 mg oral tablet  1 tab(s) orally once a day   vitamin b-12 1000 mcg oral tablet  1 tab(s) orally once a day   pantoprazole 20 mg oral delayed release tablet   orally 2 times a day   multivitamin  1   once a day   flax seed oil - oral capsule  1  orally once a day   valacyclovir 1 g oral tablet  1 tab(s) orally every 8 hours   potassium chloride 10 meq oral capsule, extended release  1 cap(s) orally 2 times a day   metoclopramide 10  mg oral tablet  1 tab(s) orally 3 times a day   prednisone 10 mg oral tablet  1 tab(s) orally once a day    STOP TAKING THE FOLLOWING MEDICATION(S):    eye drops for herpez in eye: for left eye fentanyl 12 mcg/hr transdermal film, extended  release: 1 patch transdermal every 72 hours-due change tomorrow  Physician's Instructions:  Home Health? No   Treatments None   Home Oxygen? No   Diet Regular   Dietary Supplements Ensure   Dietary Supplements Frequency Two times per day   Diet Consistency Regular Consistency   Activity Limitations As tolerated   Referrals None   Return to Work Not Applicable   Time frame for Follow Up Appointment 1-2 weeks   Other Comments in 7 days  to see Dr Oliva Bustard   Electronic Signatures: Jobe Gibbon (MD)  (Signed 08-Jun-15 17:25)  Authored: ADMISSION DATE AND DIAGNOSIS, CHIEF COMPLAINT/HPI, Allergies, PERTINENT LABS, PERTINENT RADIOLOGY STUDIES, [place], PATIENT INSTRUCTIONS   Last Updated: 08-Jun-15 17:25 by Jobe Gibbon (MD)

## 2014-10-09 NOTE — Consult Note (Signed)
Chief Complaint:  Subjective/Chief Complaint Pt denies abdominal pain.  No vomiting.  Denies bleeding from ostomy.   VITAL SIGNS/ANCILLARY NOTES: **Vital Signs.:   02-Jul-15 08:04  Temperature Temperature (F) 98.3  Celsius 36.8  Temperature Source axillary  Pulse Pulse 84  Respirations Respirations 18  Systolic BP Systolic BP 852  Diastolic BP (mmHg) Diastolic BP (mmHg) 70  Mean BP 87  Pulse Ox % Pulse Ox % 91  Pulse Ox Activity Level  At rest  Oxygen Delivery 2L   Brief Assessment:  GEN well developed, no acute distress, thin, A/Ox3   Cardiac Regular   Respiratory normal resp effort   Gastrointestinal details normal Soft  No rebound tenderness  No gaurding  +moderate distention, +ostomy site clear, no output, +faint BS,   EXTR negative cyanosis/clubbing, negative edema   Additional Physical Exam Skin: pink, warm, dry   Lab Results: Routine Chem:  02-Jul-15 04:25   Potassium, Serum 3.5 (Result(s) reported on 17 Dec 2013 at 04:40AM.)  Routine Hem:  02-Jul-15 04:25   WBC (CBC) 8.5  RBC (CBC)  3.47  Hemoglobin (CBC)  10.0  Hematocrit (CBC)  30.4  Platelet Count (CBC)  122  MCV 88  MCH 28.7  MCHC 32.8  RDW  17.5  Neutrophil % 80.3  Monocyte % 7.2  Eosinophil % 0.6  Basophil % 0.2  Neutrophil #  6.8  Lymphocyte # 1.0  Monocyte # 0.6  Eosinophil # 0.0  Basophil # 0.0 (Result(s) reported on 17 Dec 2013 at 04:40AM.)   Assessment/Plan:  Assessment/Plan:  Assessment Significant anastomotic stricture ?obstruction:  Surgery following.  ?mass, scar tissue or obstruction I have discussed her care with Dr Evangeline Gula [facility] & our plan of care is below.   Plan Surgery & oncology following conservatively Continue supportive measures Will sign off, call if we can be of further assistance   Electronic Signatures: Andria Meuse (NP)  (Signed 02-Jul-15 13:36)  Authored: Chief Complaint, VITAL SIGNS/ANCILLARY NOTES, Brief Assessment, Lab Results, Assessment/Plan   Last  Updated: 02-Jul-15 13:36 by Andria Meuse (NP)

## 2014-10-17 NOTE — Discharge Summary (Signed)
PATIENT NAME:  Whitney Meza, Whitney Meza MR#:  [id number] DATE OF BIRTH:  1940/08/09  DATE OF ADMISSION:  04/07/2014 DATE OF DISCHARGE:  04/08/2014   PRESENTING COMPLAINT: Severe abdominal pain and vomiting.   DISCHARGE DIAGNOSES: 1.  Recurrent abdominal small bowel obstruction, acute on chronic.  2.  Metastatic stage IV colon cancer. The patient's last chemotherapy was three weeks ago.  3.  Hypertension.  4.  Hypokalemia, hypomagnesemia.  5.  Hypercholesterolemia.   CONSULTATIONS: 1.  Dr. Candiss Norse, surgery.  2.  Efraim Kaufmann, MD, palliative care  3.  Sandeep R. Ma Hillock, MD, oncology  HOSPITAL COURSE: [name] is a very pleasant 74 year old Caucasian female with past medical history of stage IV colon cancer status post hemicolectomy and multiple surgeries for her small bowel obstruction, who comes to the Emergency Room, got admitted with significant abdominal pain along with vomiting and nausea. The patient was started on IV fluids, kept n.p.o. She was evaluated by Dr. Jamal Collin.  In reviewing her CT scan, patient was not recommended for any other surgical options. The patient was started on some sips of water p.o. liquid diet. Her pain was controlled with IV pain medications along with fentanyl patch. Dr. Ermalinda Memos and her palliative care team assisted in the patient management for pain along  with discussion with the patient and her daughter regarding goals of treatment. The patient along with her daughter made a decision for transfer to hospice home since she was requiring aggressive pain management and 24/7 care. The patient remained a no code, DNR.  Dr. Ma Hillock from oncology was consulted and recommended as well no further chemotherapy given the patient's overall declined condition and recommended palliative care with hospice services.   Hypokalemia hypomagnesemia, repleted.    Stage IV colon cancer, off chemotherapy for past three weeks.   TIME SPENT: 40 minutes.   ____________________________ Hart Rochester Posey Pronto, MD sap:jp D: 2014/04/18 24:40:10 ET T: 2014-04-18 10:38:58 ET JOB#: [id number]  cc: Earma Nicolaou A. Posey Pronto, MD, <Dictator> Ilda Basset MD ELECTRONICALLY SIGNED 04/29/2014 7:45
